# Patient Record
Sex: Male | Born: 1955
Health system: Southern US, Community
[De-identification: ages and names within clinical notes are randomized; demographics above are authoritative.]

## PROBLEM LIST (undated history)

## (undated) DIAGNOSIS — I10 Essential (primary) hypertension: Secondary | ICD-10-CM

## (undated) DIAGNOSIS — E119 Type 2 diabetes mellitus without complications: Secondary | ICD-10-CM

## (undated) DIAGNOSIS — I456 Pre-excitation syndrome: Secondary | ICD-10-CM

## (undated) DIAGNOSIS — G40909 Epilepsy, unspecified, not intractable, without status epilepticus: Secondary | ICD-10-CM

## (undated) DIAGNOSIS — Z9889 Other specified postprocedural states: Secondary | ICD-10-CM

## (undated) DIAGNOSIS — R112 Nausea with vomiting, unspecified: Secondary | ICD-10-CM

## (undated) HISTORY — PX: ROTATOR CUFF REPAIR: SHX139

---

## 1998-03-10 ENCOUNTER — Emergency Department (HOSPITAL_COMMUNITY): Admission: EM | Admit: 1998-03-10 | Discharge: 1998-03-10 | Payer: Self-pay | Admitting: Emergency Medicine

## 1998-10-14 ENCOUNTER — Emergency Department (HOSPITAL_COMMUNITY): Admission: EM | Admit: 1998-10-14 | Discharge: 1998-10-14 | Payer: Self-pay | Admitting: Emergency Medicine

## 1998-10-15 ENCOUNTER — Emergency Department (HOSPITAL_COMMUNITY): Admission: RE | Admit: 1998-10-15 | Discharge: 1998-10-16 | Payer: Self-pay | Admitting: Family Medicine

## 1998-10-15 ENCOUNTER — Encounter: Payer: Self-pay | Admitting: Family Medicine

## 2001-10-21 ENCOUNTER — Encounter: Payer: Self-pay | Admitting: Internal Medicine

## 2001-10-21 ENCOUNTER — Encounter: Admission: RE | Admit: 2001-10-21 | Discharge: 2001-10-21 | Payer: Self-pay | Admitting: Internal Medicine

## 2001-11-03 DIAGNOSIS — I456 Pre-excitation syndrome: Secondary | ICD-10-CM

## 2001-11-03 HISTORY — DX: Pre-excitation syndrome: I45.6

## 2002-01-21 ENCOUNTER — Encounter: Payer: Self-pay | Admitting: *Deleted

## 2002-01-21 ENCOUNTER — Inpatient Hospital Stay (HOSPITAL_COMMUNITY): Admission: EM | Admit: 2002-01-21 | Discharge: 2002-01-24 | Payer: Self-pay | Admitting: Emergency Medicine

## 2002-03-04 ENCOUNTER — Ambulatory Visit (HOSPITAL_COMMUNITY): Admission: RE | Admit: 2002-03-04 | Discharge: 2002-03-05 | Payer: Self-pay | Admitting: Internal Medicine

## 2003-01-28 ENCOUNTER — Inpatient Hospital Stay (HOSPITAL_COMMUNITY): Admission: EM | Admit: 2003-01-28 | Discharge: 2003-01-30 | Payer: Self-pay | Admitting: Emergency Medicine

## 2003-01-28 ENCOUNTER — Encounter: Payer: Self-pay | Admitting: Emergency Medicine

## 2003-01-30 ENCOUNTER — Encounter: Payer: Self-pay | Admitting: Cardiology

## 2003-03-20 ENCOUNTER — Encounter: Admission: RE | Admit: 2003-03-20 | Discharge: 2003-03-20 | Payer: Self-pay | Admitting: Family Medicine

## 2003-03-20 ENCOUNTER — Encounter: Payer: Self-pay | Admitting: Family Medicine

## 2003-06-23 ENCOUNTER — Observation Stay (HOSPITAL_COMMUNITY): Admission: EM | Admit: 2003-06-23 | Discharge: 2003-06-24 | Payer: Self-pay

## 2003-06-23 ENCOUNTER — Encounter: Payer: Self-pay | Admitting: Emergency Medicine

## 2003-07-10 ENCOUNTER — Encounter: Payer: Self-pay | Admitting: Emergency Medicine

## 2003-07-10 ENCOUNTER — Emergency Department (HOSPITAL_COMMUNITY): Admission: EM | Admit: 2003-07-10 | Discharge: 2003-07-11 | Payer: Self-pay | Admitting: Emergency Medicine

## 2003-07-20 ENCOUNTER — Ambulatory Visit (HOSPITAL_COMMUNITY): Admission: RE | Admit: 2003-07-20 | Discharge: 2003-07-20 | Payer: Self-pay | Admitting: Cardiology

## 2004-01-23 ENCOUNTER — Encounter: Admission: RE | Admit: 2004-01-23 | Discharge: 2004-01-23 | Payer: Self-pay | Admitting: Family Medicine

## 2004-06-16 ENCOUNTER — Emergency Department (HOSPITAL_COMMUNITY): Admission: EM | Admit: 2004-06-16 | Discharge: 2004-06-16 | Payer: Self-pay | Admitting: Family Medicine

## 2004-07-22 ENCOUNTER — Encounter: Admission: RE | Admit: 2004-07-22 | Discharge: 2004-07-22 | Payer: Self-pay | Admitting: Family Medicine

## 2004-09-17 ENCOUNTER — Ambulatory Visit: Payer: Self-pay | Admitting: Internal Medicine

## 2005-07-27 ENCOUNTER — Emergency Department (HOSPITAL_COMMUNITY): Admission: EM | Admit: 2005-07-27 | Discharge: 2005-07-27 | Payer: Self-pay | Admitting: Family Medicine

## 2006-01-02 ENCOUNTER — Encounter: Admission: RE | Admit: 2006-01-02 | Discharge: 2006-01-02 | Payer: Self-pay | Admitting: Family Medicine

## 2006-03-04 ENCOUNTER — Encounter: Payer: Self-pay | Admitting: Emergency Medicine

## 2006-08-26 ENCOUNTER — Ambulatory Visit: Payer: Self-pay | Admitting: Family Medicine

## 2006-10-02 ENCOUNTER — Ambulatory Visit: Payer: Self-pay | Admitting: Family Medicine

## 2006-11-13 ENCOUNTER — Ambulatory Visit: Payer: Self-pay | Admitting: Internal Medicine

## 2006-11-26 ENCOUNTER — Encounter: Admission: RE | Admit: 2006-11-26 | Discharge: 2007-01-21 | Payer: Self-pay | Admitting: Family Medicine

## 2006-12-27 DIAGNOSIS — I10 Essential (primary) hypertension: Secondary | ICD-10-CM

## 2006-12-27 DIAGNOSIS — G43009 Migraine without aura, not intractable, without status migrainosus: Secondary | ICD-10-CM | POA: Insufficient documentation

## 2007-07-16 ENCOUNTER — Encounter: Admission: RE | Admit: 2007-07-16 | Discharge: 2007-07-16 | Payer: Self-pay | Admitting: Family Medicine

## 2008-05-29 ENCOUNTER — Emergency Department (HOSPITAL_COMMUNITY): Admission: EM | Admit: 2008-05-29 | Discharge: 2008-05-29 | Payer: Self-pay | Admitting: Emergency Medicine

## 2008-06-14 ENCOUNTER — Encounter: Admission: RE | Admit: 2008-06-14 | Discharge: 2008-06-14 | Payer: Self-pay | Admitting: Family Medicine

## 2008-08-12 ENCOUNTER — Emergency Department (HOSPITAL_COMMUNITY): Admission: EM | Admit: 2008-08-12 | Discharge: 2008-08-12 | Payer: Self-pay | Admitting: Emergency Medicine

## 2009-01-04 ENCOUNTER — Emergency Department (HOSPITAL_COMMUNITY): Admission: EM | Admit: 2009-01-04 | Discharge: 2009-01-04 | Payer: Self-pay | Admitting: Emergency Medicine

## 2009-08-09 ENCOUNTER — Emergency Department (HOSPITAL_COMMUNITY): Admission: EM | Admit: 2009-08-09 | Discharge: 2009-08-09 | Payer: Self-pay | Admitting: Family Medicine

## 2011-02-13 LAB — POCT CARDIAC MARKERS
CKMB, poc: 1.1 ng/mL (ref 1.0–8.0)
CKMB, poc: 2.2 ng/mL (ref 1.0–8.0)
Myoglobin, poc: 109 ng/mL (ref 12–200)
Troponin i, poc: 0.07 ng/mL (ref 0.00–0.09)

## 2011-02-13 LAB — DIFFERENTIAL
Basophils Relative: 1 % (ref 0–1)
Lymphocytes Relative: 26 % (ref 12–46)
Lymphs Abs: 1.7 10*3/uL (ref 0.7–4.0)
Monocytes Absolute: 0.5 10*3/uL (ref 0.1–1.0)
Monocytes Relative: 7 % (ref 3–12)
Neutro Abs: 4.2 10*3/uL (ref 1.7–7.7)
Neutrophils Relative %: 64 % (ref 43–77)

## 2011-02-13 LAB — COMPREHENSIVE METABOLIC PANEL
Albumin: 3.7 g/dL (ref 3.5–5.2)
BUN: 8 mg/dL (ref 6–23)
Calcium: 9.1 mg/dL (ref 8.4–10.5)
Creatinine, Ser: 0.93 mg/dL (ref 0.4–1.5)
Glucose, Bld: 102 mg/dL — ABNORMAL HIGH (ref 70–99)
Potassium: 3.4 mEq/L — ABNORMAL LOW (ref 3.5–5.1)
Total Protein: 6.3 g/dL (ref 6.0–8.3)

## 2011-02-13 LAB — CBC
HCT: 39.8 % (ref 39.0–52.0)
Hemoglobin: 13.6 g/dL (ref 13.0–17.0)
MCHC: 34.3 g/dL (ref 30.0–36.0)
Platelets: 245 10*3/uL (ref 150–400)
RDW: 14.3 % (ref 11.5–15.5)

## 2011-02-13 LAB — PROTIME-INR: INR: 1 (ref 0.00–1.49)

## 2011-02-13 LAB — APTT: aPTT: 32 seconds (ref 24–37)

## 2011-03-21 NOTE — Procedures (Signed)
[place]. [facility]  Patient:    Daniel Yoder, Daniel Yoder Visit Number: [id number] MRN: [id number]          Service Type: CAT Location: 2000 2003 01 Attending Physician:  Lewayne Bunting Dictated by:   Doylene Canning. Ladona Ridgel, M.D. [facility] Proc. Date: 03/04/02 Admit Date:  03/04/2002 Discharge Date: 03/05/2002   CC:         Armanda Magic, M.D.  Meade Maw, M.D.  Kathrine Cords   Procedure Report  PROCEDURE:  Electrophysiologic study and radiofrequency catheter ablation of a far left lateral accessory pathway with manifest Wolff-Parkinson-White syndrome and inducible atrioventricular reentrant tachycardia.  INTRODUCTION:  The patient is a very pleasant 55 year old man with a long history of tachypalpitations who was recently hospitalized with documented SVT and WPW syndrome.  He continues to have symptoms despite medical therapy and is now referred for electrophysiologic study and catheter ablation.  DESCRIPTION OF PROCEDURE:  After informed consent was obtained, the patient was taken to the diagnostic EP lab in a fasted state.  After the usual preparation and draping, intravenous fentanyl and midazolam were given for sedation.  The 6 French hexapolar catheter was inserted percutaneously in the right jugular vein and advanced into the coronary sinus.  Of note, for unclear reasons the catheter could not be maneuvered all the way out into the coronary sinus but could not traverse the area past approximately 5 oclock on the mitral annulus.  Next the right femoral vein was punctured and a 5 French quadripolar catheter inserted into the right ventricle.  Next a 5 French quadripolar catheter was inserted by way of the right femoral vein into the His bundle region.  After measurement of the basic intervals, rapid ventricular pacing was carried out from the RV apex demonstrating eccentric atrial activation with earliest atrial activation in the distal coronary sinus catheter.   Additional rapid ventricular pacing down to 330 msec resulted in the retrograde accessory pathway Wenckebach cycle length.  Next programmed ventricular stimulation was carried out from the RV apex at a basic drive cycle length of 259 msec.  The S1-S2 interval was stepwise decreased from 440 msec down to 250 msec, where the retrograde accessory pathway ERP was observed.  Again during programmed ventricular stimulation the atrial activation sequence was eccentric and nondecremental.  During programmed ventricular stimulation the patient had inducible SVT at cycle lengths of 500/380.  Next rapid atrial pacing was carried out from the coronary sinus at a basic drive cycle length of 563 msec and stepwise decreased down to 370 msec.  During rapid atrial pacing from the coronary sinus there was maximal pre-excitation.  The antegrade accessory pathway Wenckebach cycle length was 370 msec.  Next programmed atrial stimulation was carried out from the coronary sinus at a basic drive cycle length of 875 msec.  The S1-S2 interval was stepwise decreased from 440 msec down to 350 msec, where the antegrade pathway ERP was observed.  Additional programmed ventricular stimulation was then carried out, resulting in the initiation of SVT.  The earliest activation during SVT was in the left lateral portion of the mitral valve annulus.  PVCs placed at the time of His bundle refractoriness resulted in clear-cut atrial pre-excitation.  A diagnosis of AV reentrant tachycardia clearly in hand utilizing the left lateral accessory pathway for V to A conduction in the circuit, the ablation catheter was subsequently maneuvered into the right femoral vein and the atrial septum was probed.  The atrial septum was found to be probe-patent for  a patent foramen ovale and the ablation catheter advanced into the left atrium, and mapping was carried out in the left atrium.  The ablation catheter was subsequently maneuvered onto  the atrial insertion of the pathway, and two RF energy applications were delivered.  During ablation there was continued accessory pathway conduction.  At this point the ablation catheter was removed and advanced into the right femoral artery and placed retrograde across the aortic valve into the left ventricle.  Additional mapping was then carried out along the left ventricular insertion of the accessory pathway on the mitral annulus.  Earlier and earlier activations were seen during ventricular pacing with the catheter being advanced more and more lateral on the mitral valve annulus.  RF energy applications were subsequently carried out between 3 oclock and 4 oclock on the ventricular insertion of the mitral annulus.  During multiple RF energy applications there was accessory pathway block but conduction recurred.  As the catheter was maneuvered up to approximately 3 oclock on the mitral valve annulus, the ninth RF energy application resulted in termination of accessory pathway conduction four seconds into RF and finally VA block.  A single bonus RF energy application was then delivered, and the patient was observed for approximately 45 minutes with no residual accessory pathway conduction and no SVT noted.  At this point the catheters were removed, hemostasis was assured, and the patient returned to his room in satisfactory condition.  COMPLICATIONS:  There were no immediate procedural complications.  RESULTS:  A. BASELINE ELECTROCARDIOGRAM:  The baseline ECG demonstrates normal sinus    rhythm with a short PR interval and manifest WPW syndrome.  The baseline    ECG suggested a left lateral accessory pathway with a large R-wave in V1.  B. BASELINE INTERVALS:  The HV interval was 28 msec and QRS duration 127 msec    prior to catheter ablation.  After catheter ablation the HV interval was    50 msec and the QRS duration was 105 msec.   C. RAPID VENTRICULAR PACING:  Rapid ventricular  pacing from the RV apex    demonstrated the earliest atrial activation in the lateral portion of the    mitral valve annulus.  The retrograde accessory pathway Wenckebach cycle    length was 330 msec.  D. PROGRAMMED VENTRICULAR STIMULATION:  Programmed ventricular stimulation    was carried out from the RV apex at a basic drive cycle length of 606 msec.    The S1-S2 interval was stepwise decreased from 440 msec down to 250 msec,    where the retrograde accessory pathway ERP was observed.  Once again during    programmed ventricular stimulation, the earliest atrial activation was    eccentric and nondecremental.  E. RAPID ATRIAL PACING:  Rapid atrial pacing was carried out from the coronary    sinus at a pacing cycle length of 490 msec and was stepwise decreased down    to 370 msec, where the antegrade pathway Wenckebach cycle length was    observed.  During rapid atrial pacing there was maximal ventricular pre-    excitation.  F. PROGRAMMED ATRIAL STIMULATION:  Programmed atrial stimulation was carried    out from the coronary sinus at a basic drive cycle length of 301 msec.  The    S1-S2 interval was stepwise decreased from 440 msec down to 350 msec, where    the antegrade pathway ERP was observed.  Again during programmed atrial    stimulation, there was maximal ventricular  pre-excitation.  G. ARRHYTHMIAS OBSERVED:  AV reentrant tachycardia.  Initiation:  Programmed    ventricular stimulation.  Duration:  Sustained.  Termination:  Spontaneous.    Cycle length:  450 msec.  During isoproterenol infusion the cycle length    was 420 msec.  H. MAPPING:  Mapping of the patients accessory pathway demonstrated a far    left lateral accessory pathway.  Mapping was made more difficult by the    coronary sinus catheter not being able to be advanced past 5 oclock on the    mitral annulus in the LAO projection.  I. RADIOFREQUENCY ENERGY APPLICATION:  A total of 10 RF energy applications     were delivered.  The first two were delivered by way of a patent foramen    ovale onto the atrial insertion of the mitral annulus, but accessory    pathway conduction persisted.  Because of the far left lateral nature of    the pathway, the ablation catheter was maneuvered into the left ventricle    retrograde across the aortic valve by way of the right femoral artery and    eight additional RF energy applications were delivered, resulting in    discontinuation of accessory pathway conduction and rendering the pathway    noninducible.  The patient was observed for 45 minutes and had no residual    accessory pathway conduction.  CONCLUSION:  This study demonstrates successful electrophysiologic study and RF catheter ablation of inducible AV reentrant tachycardia with manifest by WPW syndrome.  There were no immediate procedural complications.Dictated by: Doylene Canning. Ladona Ridgel, M.D. [facility] Attending Physician:  Lewayne Bunting DD:  03/04/02 TD:  03/07/02 Job: [id number]
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
## 2011-03-21 NOTE — H&P (Signed)
[place]. [facility]  Patient:    Daniel Yoder, Daniel Yoder Visit Number: [id number] MRN: [id number]          Service Type: MED Location: [phone] Attending Physician:  Mora Appl Dictated by:   Meade Maw, M.D. Admit Date:  01/21/2002 Discharge Date: 01/24/2002   CC:         Redmond Baseman, M.D.   History and Physical  REFERRING PHYSICIAN:  Redmond Baseman, M.D.  REASON FOR ADMISSION:  Palpitations, nonsustained ventricular tachycardia, and evidence of Wolff-Parkinson-White syndrome on 12-lead.  HISTORY OF PRESENT ILLNESS:  Eulogio is a very pleasant, 55 year old, African-American male who noticed palpitations on Wednesday morning which were intermittent and persisting for approximately 10-15 minutes.  He presented to the [facility] on Wednesday and was advised to have a cardiology work-up as an outpatient.  There was no associated chest pain, dyspnea, presyncope, or syncope.  The nurse called the patient today.  The patient reported palpitations every 5-10 minutes and he was advised to come to his family doctors office.  He was evaluated by Redmond Baseman, M.D., who subsequently faxed an EKG to Armanda Magic, M.D.  WPW was noted and the patient was recommended to the come to the ER for further evaluation and admission.  PAST MEDICAL HISTORY:  Significant for hypertension.  CURRENT MEDICATIONS:  Atenolol 50 mg daily.  ALLERGIES:  He has no known drug allergies.  PAST SURGICAL HISTORY:  Significant for arthroscopic surgery on his right hip.  REVIEW OF SYSTEMS:  He notes frequent headaches.  He has no change in bowel or bladder habits.  No tobacco use.  No alcohol use.  No illicit drug use. Occasional pedal edema.  He has two cups of coffee daily, but has not noted any association in the palpitations with caffeine intake.  SOCIAL HISTORY:  He is married and lives with his wife and 60-year-old daughter.  He works as an  Hydrographic surveyor.  He is not involved in any regular exercise program.  PHYSICAL EXAMINATION:  A middle-aged male in no acute distress.  He is anxious.  He is hesitant to go home, concerned that he may have a heart attack.  His telemetry reveals a sinus rhythm.  There is no ectopy noted.  VITAL SIGNS:  His blood pressure is 155/96, heart rate 70, he is afebrile, and respiratory rate 16.  PULMONARY:  Breath sounds which are equal and clear to auscultation.  No use of accessory muscles.  CARDIOVASCULAR:  Normal S1.  Normal S2.  Regular rate and rhythm.  No murmurs, rubs, or gallops noted.  ABDOMEN:  Soft, benign, and nontender.  No unusual bruits or pulsations noted.  EXTREMITIES:  Distal pulses which are equal and palpable.  SKIN:  Warm and dry.  NEUROLOGIC:  Nonfocal.  Motor is 5/5 throughout.  LABORATORY DATA:  The ECG reveals a sinus rhythm, intraventricular conduction delay consistent with WPW, and a PR interval of 12.  Other laboratory data is pending at this time.  IMPRESSION: 1. Ongoing palpitations and nonsustained ventricular tachycardia in a    middle-aged male who is somewhat anxious.  He will be admitted for    observation.  EPS will be consulted as the patient is rather anxious over    these episodes.  We will obtain a TSH and serial cardiac enzymes. 2. Hypertension.  The blood pressure is adequately controlled on atenolol.    The diastolic pressure is slightly elevated at this time,  most likely    secondary to anxiety. 3. Health maintenance.  Will obtain a fasting lipid profile.  Telemetry    monitoring will be continued.  The patient has been instructed to notify    the nurse when he has the symptoms so that we can verify his symptoms with    underlying arrhythmia. Dictated by:   Meade Maw, M.D. Attending Physician:  Meade Maw A DD:  01/21/02 TD:  01/22/02 Job: [id number]

## 2011-03-21 NOTE — Cardiovascular Report (Signed)
NAME:  Daniel Yoder, Daniel Yoder NO.:  [id number]   MEDICAL RECORD NO.:  [id number]                   PATIENT TYPE:  OIB   LOCATION:  2899                                 FACILITY:  [facility]   PHYSICIAN:  Salvadore Farber, M.D.             DATE OF BIRTH:  1956/08/24   DATE OF PROCEDURE:  DATE OF DISCHARGE:  07/20/2003                              CARDIAC CATHETERIZATION   PROCEDURES:  1. Left heart catheterization.  2. Left ventriculography.  3. Coronary angiography.   INDICATIONS FOR PROCEDURE:  The patient is a 55 year old gentleman with a  history of WPW treated by radiofrequency ablation by Dr. Ladona Ridgel in May 2003.  Since then he has had only rare palpitations.  Cardiac risk factors are  hypertension, hypertriglyceridemia and rare tobacco use.  He has been seen  in the emergency room three times over the past six months with chest  discomfort.  He has ruled out for a myocardial infarction each time.  Despite a negative ETT Cardiolite in March of this year he is referred for  diagnostic angiography to clear the air.   PROCEDURAL TECHNIQUE:  Informed consent was obtained.  Under 1% lidocaine  local anesthesia a 6 French sheath was placed in the right femoral artery  using the modified Seldinger technique.  Diagnostic angiography and  ventriculography were performed using JL4, JR4 and pigtail catheters.  During the procedure the J wire inadvertently entered what appeared to be  the celiac axis and extending into the hepatic artery.  Therefore, abdominal  aortography was performed to exclude perforation.  This demonstrated no  evidence of perforation.  The patient tolerated the procedure well and was  transferred to the holding room in stable condition.  The sheaths will be  removed there.   COMPLICATIONS:  None.   FINDINGS:  1. Left ventricle:  118/11/18.  Ejection fraction 65% without regional wall     motion abnormality.  2. No aortic stenosis or  mitral regurgitation.  3. Left main:  Angiographically normal.  4. Left anterior descending:  Large vessel which wraps around the apex of     the heart to supply a large portion of the inferior wall.  It gives rise     to a single diagonal branch.  The vessel is angiographically normal.  5. Ramus intermedius:  Small vessel which is angiographically normal.  6. Circumflex:  Moderate size vessel giving rise to two obtuse marginal's.     It is angiographically normal.  7. Right coronary artery:  Small but technically dominant vessel which is     angiographically normal.    IMPRESSION AND RECOMMENDATIONS:  Normal coronary arteries as well as normal  left ventricular size and systolic function.  Suspect a non-cardiac etiology  to his chest discomfort.  He will follow-up with Dr. Modesto Charon and Dr. Ladona Ridgel.  Salvadore Farber, M.D.    WED/MEDQ  D:  07/20/2003  T:  07/21/2003  Job:  [id number]   cc:   Thelma Barge P. Modesto Charon, M.D.  [address]  [place]  [place] [address]  Fax: [phone]   Doylene Canning. Ladona Ridgel, M.D.

## 2011-03-21 NOTE — Discharge Summary (Signed)
[place]. [facility]  Patient:    Daniel Yoder, Daniel Yoder Visit Number: [id number] MRN: [id number]          Service Type: CAT Location: 2000 2003 01 Attending Physician:  Lewayne Bunting Dictated by:   Joellyn Rued, P.A.-C. Admit Date:  03/04/2002 Disc. Date: 03/05/02   CC:         Redmond Baseman, M.D.  Doylene Canning. Ladona Ridgel, M.D. [facility]   Referring Physician Discharge [place]  DATE OF BIRTH:  [birth year]  ADMITTING PHYSICIAN:  Dr. Ladona Ridgel.  DISCHARGING PHYSICIAN:  Dr. Graciela Husbands.  SUMMARY OF HISTORY:  Mr. Dubray is a 55 year old black male, who was referred to Dr. Ladona Ridgel by Dr. Modesto Charon in regard to evaluation of WPW.  He was seen in the office on January 31, 2002.  He describes a long history of tachy-palpitations which have increased in frequency and severity.  On cardiac monitoring, he was noted to have SVT.  He denies any associated syncope, near-syncope, shortness of breath, or chest discomfort.  His history is notable for hypertension and is on atenolol for that.  He also has a history of hip surgery in 1996 and remote tobacco use.  LABORATORY DATA PREADMISSION:  H&H was 13.3, and 41.8.  MCH was slightly low at 24.6, and MCHC at 31.8, MCV at 77.4.  Platelets were 249, WBC 5.3, BUN 12, creatinine 1.2, glucose 151.  Potassium 4.0, sodium 135.  PT is 10.9, PTT is 29.4.  EKG prior to admission showed sinus bradycardia, evidence of WPW, with probable left lateral accessory pathway.  HOSPITAL COURSE:  Dr. Ladona Ridgel performed EP study and radiofrequency ablation on the left lateral pathway without complication.  Please refer to dictated note. Post sheath removal and bed rest, he was ambulating without difficulty, and on Mar 05, 2002, after reviewing, Dr. Graciela Husbands felt that he could be discharged home.  DISCHARGE DIAGNOSES: 1. Supraventricular tachycardia / Wolff-Parkinson-White, status post    radiofrequency ablation of left lateral pathway. 2. History of  hypertension.  DISCHARGE MEDICATIONS: 1. He is asked to continue his atenolol 50 mg q.d. for hypertension. 2. Take a coated aspirin 325 mg q.d. x 6 weeks.  DISCHARGE INSTRUCTIONS: 1. He was advised no lifting, driving, sexual activity, or heavy exertion for    two days. 2. Maintain a low salt, low fat, low cholesterol diet. 3. If he had any problems with his catheterization site, he was asked to call    our office immediately. 4. He will call on Monday to arrange a 3-4 week appointment with Dr. Ladona Ridgel. Dictated by:   Joellyn Rued, P.A.-C. Attending Physician:  Lewayne Bunting DD:  03/05/02 TD:  03/05/02 Job: [id number]

## 2011-03-21 NOTE — Discharge Summary (Signed)
[place]  Patient:    Daniel Yoder, Daniel Yoder Visit Number: [id number] MRN: [id number]          Service Type: MED Location: [phone] Attending Physician:  Meade Maw A Dictated by:   Anselm Lis, N.P. Admit Date:  01/21/2002 Discharge Date: 01/24/2002   CC:         Redmond Baseman, M.D.   Discharge Summary  DATE OF BIRTH:  1956/03/04  DISCHARGE DIAGNOSES: 1. Frequent sensations of "heart beating harder" without associated    dysrhythmia on telemetry monitor and without associated symptoms.    Electrocardiogram  reveals preexcitation ( Wolfe-Parkinson-White).    Elevated total CK of 480 but without significant MB fraction and negative    troponin I.  (CKs trended downward during the course of admission.)  A    2-D echocardiogram was done, but result is pending at the time of this    dictation. 2. History of borderline hypertension on atenolol. 3. History of early chronic obstructive pulmonary disease, quite tobacco use    September 1999. 4. History of acute polyserositis of unknown etiology involving mostly the    pleura, seen in past by Dr. Sandrea Hughs, responding well to nonsteroidals    (1999).  PLAN:  Dr. Mayford Knife spoke with Dr. Sharrell Ku who will see patient within a week after discharge for further electrophysiology evaluation and possible ablation.  HISTORY OF PRESENT ILLNESS:  Daniel Yoder is a very pleasant 55 year old gentleman who noticed sensation of his heart beating harder approximately two days prior to admission, and this sensation seemed to occur with increased frequency.  He presented to primary care Evon Lopezperez at the office where EKG was consistent with Wolfe-Parkinson-White.  The patient was referred to [facility] ER for further evaluation and admission.  HOSPITAL COURSE:  For details of hospital course as above, essentially uneventful admission.  There was no ectopy on telemetry throughout the course of his  hospital stay.  Blood pressure adequately controlled.  TSH within normal range.  DIAGNOSTIC DATA:  WBC 5.3, hemoglobin 13.4, hematocrit 38.8, platelets 246. Coags within normal range.  Sodium 140, potassium 4.1, chloride 106, CO2 29, glucose 114, BUN 12, creatinine 1.1.  LFTs all within normal range.  First CK 480, MB fraction 4.9, troponin I 0.01.  Second CK 414, MB fraction 3.3, troponin I 0.03.  Third CK 404, MB fraction 3.5.  Cholesterol 193, TSH within normal range at 1.448.  EKG revealed normal sinus rhythm with intraventricular conduction delay consistent with Wolfe-Parkinson-White with PR interval of 12.  Total time preparing discharge approximately 30 minutes including dictating Discharge Summary, filling out and reviewing discharge instructions for patient, and arranging followup office visit with Dr. Bruna Potter office. Dictated by:   Anselm Lis, N.P. Attending Physician:  Mora Appl DD:  01/31/02 TD:  01/31/02 Job: [phone] [id number]
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
## 2011-03-21 NOTE — Assessment & Plan Note (Signed)
[name]                          [name]   NAME:Daniel Yoder, Daniel Yoder                      MRN:          [id number]  DATE:08/26/2006                            DOB:          1956/05/09    REASON FOR VISIT:  Migraines.   Mr. Husted is a 55 year old male patient of Dr. Modesto Charon, here to establish.  He reports that about 3 months ago, he was having visual disturbances.  He  was referred by Dr. Marny Lowenstein to see an ophthalmologist, who diagnosed him with  migraines.  He reports that he has had 2 to 3 migraines per month.  He  describes it as a visual aura that starts in the left eye and progresses  to a left temporal headache associated with photophobia and noise  sensitivity.  He does have some mild nausea associated with these symptoms.  Usually he is able to treat them with over-the-counter migraine medicines.  Of note, Mr. Buckwalter does have a neurologist, i.e., Dr. Alton Revere, who treats  him for epilepsy.  According to Mr. Grewe, he has told Dr. Alton Revere of his  migraines.  He was put on Topamax to treat both his epilepsy and migraines,  but, unfortunately, was not able to tolerate it.  He is currently on  Lamictal for his epilepsy.  Mr. Cimo states that there has been no  discussion regarding acute treatment of his migraine, but he does report  that the over-the-counter have been helpful in decreasing the intensity of  the pain.  He has noticed some precipitating cause, i.e. perfume.  Mr.  Feinstein had 1 migraine this morning, which has decreased in intensity as of  now.   PAST MEDICAL HISTORY:  1. Hypertension.  2. WPW status post cardiac ablation.  3. Migraine.  4. Epilepsy.   MEDICATIONS:  1. Norvasc 10 mg daily.  2. Atenolol 100 mg daily.  3. Lamictal 175 mg.   ALLERGIES:  No known drug allergies.   FAMILY HISTORY:  Noncontributory.   The patient is married with no children.  Denies any tobacco use.   REVIEW OF  SYSTEMS:  As per HPI, otherwise unremarkable.   OBJECTIVE:  Weight 202, temperature 98.3, blood pressure 146/98.  GENERAL:  We have a pleasant male, in no acute distress.  Answers questions  appropriately.  Alert and oriented x3.  HEENT:  Normocephalic, atraumatic.  Pupils are equal and reactive to light.  Extraocular muscles were intact.  No nystagmus.  NECK:  Supple.  No lymphadenopathy, carotid bruits, or JVD.  No thyromegaly.  LUNGS:  Clear.  HEART:  Regular rate and rhythm.  Normal S1, S2.  No murmurs, gallops, or  rubs heard on my examination.  EXTREMITIES:  No cyanosis, clubbing, or edema.  NEUROLOGIC:  Cranial nerves 2-12 grossly intact.  No focal sensory or motor  deficits were noted.  Cerebellar function was within normal limits.  No  pronator drift was appreciated.  Gait was uninhibited.  Mood was within  normal limits.   IMPRESSION:  1. A 55 year old male with a history of migraines.  The patient does have  a neurologist, but has not discussed with her the acute treatment of      migraine, but previously was put on Topamax, which he was not able to      tolerate.  2. Hypertension.  3. History of Wolff-Parkinson-White, status post ablation.  4. Epilepsy.   PLAN:  1. A long discussion regarding migraines and treatment was reviewed with      the patient.  Given that he has seen a neurologist, I felt that it may      be prudent that he continue care with her and discuss treatment for his      migraines since there is some cross-therapy in regards to prophylaxis      to migraines and epilepsy.  2. I did discuss the possibility of using triptans, but given his history      of hypertension as well as WPW status post ablation, I felt a little      hesitant starting these types of medication, i.e., since he has already      seen a neurologist and a previous physician who did not mention the use      of this therapy.  3. Advised the patient that we should get a copy of his  medical records to      review his history a little more thoroughly over the last year when the      diagnoses of epilepsy and migraine were made.  The patient was also      advised to keep a headache diary to see if he sees any other associated      precipitants.  4. Will try to contact Dr. Alton Revere to discuss what is the best approach in      treating Mr. [place] migraine, and I will most likely defer treatment      to her.   Again, the patient will follow up in 1 month to reassess his blood pressure  given the mild elevation today.            ______________________________  Leanne Chang, M.D.      LA/MedQ  DD:  08/26/2006  DT:  08/27/2006  Job #:  [id number]
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
## 2011-08-01 LAB — POCT I-STAT, CHEM 8
Calcium, Ion: 1.13
Chloride: 106
HCT: 42
Sodium: 141

## 2011-08-01 LAB — POCT CARDIAC MARKERS
CKMB, poc: 2.4
Troponin i, poc: <0.05

## 2014-01-16 ENCOUNTER — Emergency Department (INDEPENDENT_AMBULATORY_CARE_PROVIDER_SITE_OTHER)
Admission: EM | Admit: 2014-01-16 | Discharge: 2014-01-16 | Disposition: A | Discharge status: Home / Self Care | Payer: 59 | Source: Home / Self Care | Attending: Emergency Medicine | Admitting: Emergency Medicine

## 2014-01-16 ENCOUNTER — Encounter (HOSPITAL_COMMUNITY): Payer: Self-pay | Admitting: Emergency Medicine

## 2014-01-16 DIAGNOSIS — S80819A Abrasion, unspecified lower leg, initial encounter: Secondary | ICD-10-CM

## 2014-01-16 DIAGNOSIS — IMO0002 Reserved for concepts with insufficient information to code with codable children: Secondary | ICD-10-CM

## 2014-01-16 HISTORY — DX: Essential (primary) hypertension: I10

## 2014-01-16 NOTE — Discharge Instructions (Signed)
Abrasion °An abrasion is a cut or scrape of the skin. Abrasions do not extend through all layers of the skin and most heal within 10 days. It is important to care for your abrasion properly to prevent infection. °CAUSES  °Most abrasions are caused by falling on, or gliding across, the ground or other surface. When your skin rubs on something, the outer and inner layer of skin rubs off, causing an abrasion. °DIAGNOSIS  °Your caregiver will be able to diagnose an abrasion during a physical exam.  °TREATMENT  °Your treatment depends on how large and deep the abrasion is. Generally, your abrasion will be cleaned with water and a mild soap to remove any dirt or debris. An antibiotic ointment may be put over the abrasion to prevent an infection. A bandage (dressing) may be wrapped around the abrasion to keep it from getting dirty.  °You may need a tetanus shot if: °· You cannot remember when you had your last tetanus shot. °· You have never had a tetanus shot. °· The injury broke your skin. °If you get a tetanus shot, your arm may swell, get red, and feel warm to the touch. This is common and not a problem. If you need a tetanus shot and you choose not to have one, there is a rare chance of getting tetanus. Sickness from tetanus can be serious.  °HOME CARE INSTRUCTIONS  °· If a dressing was applied, change it at least once a day or as directed by your caregiver. If the bandage sticks, soak it off with warm water.   °· Wash the area with water and a mild soap to remove all the ointment 2 times a day. Rinse off the soap and pat the area dry with a clean towel.   °· Reapply any ointment as directed by your caregiver. This will help prevent infection and keep the bandage from sticking. Use gauze over the wound and under the dressing to help keep the bandage from sticking.   °· Change your dressing right away if it becomes wet or dirty.   °· Only take over-the-counter or prescription medicines for pain, discomfort, or fever as  directed by your caregiver.   °· Follow up with your caregiver within 24 48 hours for a wound check, or as directed. If you were not given a wound-check appointment, look closely at your abrasion for redness, swelling, or pus. These are signs of infection. °SEEK IMMEDIATE MEDICAL CARE IF:  °· You have increasing pain in the wound.   °· You have redness, swelling, or tenderness around the wound.   °· You have pus coming from the wound.   °· You have a fever or persistent symptoms for more than 2 3 days. °· You have a fever and your symptoms suddenly get worse. °· You have a bad smell coming from the wound or dressing.   °MAKE SURE YOU:  °· Understand these instructions. °· Will watch your condition. °· Will get help right away if you are not doing well or get worse. °Document Released: 07/30/2005 Document Revised: 10/06/2012 Document Reviewed: 09/23/2011 °Exit[employer]® Patient Information ©2014 [employer]. ° °

## 2014-01-16 NOTE — ED Notes (Signed)
C/o rash on left lower leg.  Painful to touch.  Stinging sensation.  Noticed about a week ago.  No otc meds tried.   Denies any changes in soaps/detergents. Or meds.

## 2014-01-16 NOTE — ED Provider Notes (Signed)
Medical screening examination/treatment/procedure(s) were performed by non-physician practitioner and as supervising physician I was immediately available for consultation/collaboration.  Philipp Deputy, M.D.  Harden Mo, MD 01/16/14 [phone]

## 2014-01-16 NOTE — ED Provider Notes (Signed)
CSN: [id number]     Arrival date & time 01/16/14  1933 History   First MD Initiated Contact with Patient 01/16/14 2050     Chief Complaint  Patient presents with  . Rash   (Consider location/radiation/quality/duration/timing/severity/associated sxs/prior Treatment) HPI Comments: 67m presents for eval of rash to the lateral left lower leg.  This has been presents for 5 days.  Was initially swollen and red.  Has had a burning and stinging sensation.  They are worried it might be shingles.  No fever, chills, NVD, SOB.  No Hx of similar rash or singles.  No known injury.     Past Medical History  Diagnosis Date  . Hypertension    History reviewed. No pertinent past surgical history. History reviewed. No pertinent family history. History  Substance Use Topics  . Smoking status: Current Every Day Smoker -- 0.50 packs/day    Types: Cigarettes  . Smokeless tobacco: Not on file  . Alcohol Use: No    Review of Systems  Constitutional: Negative for fever, chills and fatigue.  HENT: Negative for sore throat.   Eyes: Negative for visual disturbance.  Respiratory: Negative for cough and shortness of breath.   Cardiovascular: Negative for chest pain, palpitations and leg swelling.  Gastrointestinal: Negative for nausea, vomiting, abdominal pain, diarrhea and constipation.  Genitourinary: Negative for dysuria, urgency, frequency and hematuria.  Musculoskeletal: Negative for arthralgias, myalgias, neck pain and neck stiffness.  Skin: Positive for rash (see HPI).  Neurological: Negative for dizziness, weakness and light-headedness.    Allergies  Review of patient's allergies indicates no known allergies.  Home Medications   Current Outpatient Rx  Name  Route  Sig  Dispense  Refill  . amLODipine (NORVASC) 10 MG tablet   Oral   Take 10 mg by mouth daily.         Marland Kitchen atenolol (TENORMIN) 100 MG tablet   Oral   Take 100 mg by mouth daily.          BP 166/108  Pulse 101  Temp(Src)  98.3 F (36.8 C) (Oral)  Resp 20  SpO2 97% Physical Exam  Nursing note and vitals reviewed. Constitutional: He is oriented to person, place, and time. He appears well-developed and well-nourished. No distress.  HENT:  Head: Normocephalic.  Pulmonary/Chest: Effort normal. No respiratory distress.  Musculoskeletal:       Legs: Neurological: He is alert and oriented to person, place, and time. Coordination normal.  Skin: Skin is warm and dry. No rash noted. He is not diaphoretic.  Psychiatric: He has a normal mood and affect. Judgment normal.    ED Course  Procedures (including critical care time) Labs Review Labs Reviewed - No data to display Imaging Review No results found.   MDM   1. Abrasion of leg    Obvious abrasion. Reassured the patient. He will wait a few days to see if this starts to get better. Followup when necessary  Liam Graham, PA-C 01/16/14 2206

## 2014-12-12 ENCOUNTER — Ambulatory Visit
Admission: RE | Admit: 2014-12-12 | Discharge: 2014-12-12 | Disposition: A | Discharge status: Home / Self Care | Payer: 59 | Source: Ambulatory Visit | Attending: Unknown Physician Specialty | Admitting: Unknown Physician Specialty

## 2014-12-12 ENCOUNTER — Other Ambulatory Visit: Payer: Self-pay | Admitting: Unknown Physician Specialty

## 2014-12-12 DIAGNOSIS — M79672 Pain in left foot: Secondary | ICD-10-CM

## 2015-11-16 ENCOUNTER — Emergency Department (HOSPITAL_COMMUNITY): Payer: 59

## 2015-11-16 ENCOUNTER — Observation Stay (HOSPITAL_COMMUNITY)
Admission: EM | Admit: 2015-11-16 | Discharge: 2015-11-18 | Disposition: A | Discharge status: Home / Self Care | Payer: 59 | Attending: Internal Medicine | Admitting: Internal Medicine

## 2015-11-16 ENCOUNTER — Encounter (HOSPITAL_COMMUNITY): Payer: Self-pay | Admitting: Family Medicine

## 2015-11-16 DIAGNOSIS — R0602 Shortness of breath: Secondary | ICD-10-CM | POA: Diagnosis not present

## 2015-11-16 DIAGNOSIS — R072 Precordial pain: Secondary | ICD-10-CM | POA: Diagnosis not present

## 2015-11-16 DIAGNOSIS — E119 Type 2 diabetes mellitus without complications: Secondary | ICD-10-CM | POA: Insufficient documentation

## 2015-11-16 DIAGNOSIS — Z7984 Long term (current) use of oral hypoglycemic drugs: Secondary | ICD-10-CM | POA: Diagnosis not present

## 2015-11-16 DIAGNOSIS — E785 Hyperlipidemia, unspecified: Secondary | ICD-10-CM | POA: Diagnosis not present

## 2015-11-16 DIAGNOSIS — E860 Dehydration: Secondary | ICD-10-CM | POA: Diagnosis not present

## 2015-11-16 DIAGNOSIS — Z79899 Other long term (current) drug therapy: Secondary | ICD-10-CM | POA: Insufficient documentation

## 2015-11-16 DIAGNOSIS — Z791 Long term (current) use of non-steroidal anti-inflammatories (NSAID): Secondary | ICD-10-CM | POA: Insufficient documentation

## 2015-11-16 DIAGNOSIS — R079 Chest pain, unspecified: Secondary | ICD-10-CM | POA: Diagnosis present

## 2015-11-16 DIAGNOSIS — F17210 Nicotine dependence, cigarettes, uncomplicated: Secondary | ICD-10-CM | POA: Insufficient documentation

## 2015-11-16 DIAGNOSIS — E1129 Type 2 diabetes mellitus with other diabetic kidney complication: Secondary | ICD-10-CM

## 2015-11-16 DIAGNOSIS — I10 Essential (primary) hypertension: Secondary | ICD-10-CM | POA: Diagnosis not present

## 2015-11-16 DIAGNOSIS — R0600 Dyspnea, unspecified: Secondary | ICD-10-CM | POA: Diagnosis present

## 2015-11-16 DIAGNOSIS — M1990 Unspecified osteoarthritis, unspecified site: Secondary | ICD-10-CM | POA: Diagnosis not present

## 2015-11-16 HISTORY — DX: Pre-excitation syndrome: I45.6

## 2015-11-16 LAB — CBC
HCT: 37.8 % — ABNORMAL LOW (ref 39.0–52.0)
HEMOGLOBIN: 13.2 g/dL (ref 13.0–17.0)
MCH: 26.5 pg (ref 26.0–34.0)
MCHC: 34.9 g/dL (ref 30.0–36.0)
MCV: 75.9 fL — ABNORMAL LOW (ref 78.0–100.0)
Platelets: 262 10*3/uL (ref 150–400)
RBC: 4.98 MIL/uL (ref 4.22–5.81)
RDW: 15 % (ref 11.5–15.5)
WBC: 7.3 10*3/uL (ref 4.0–10.5)

## 2015-11-16 LAB — BASIC METABOLIC PANEL
Anion gap: 13 (ref 5–15)
BUN: 24 mg/dL — AB (ref 6–20)
CALCIUM: 10 mg/dL (ref 8.9–10.3)
CO2: 24 mmol/L (ref 22–32)
CREATININE: 1.64 mg/dL — AB (ref 0.61–1.24)
Chloride: 103 mmol/L (ref 101–111)
GFR calc Af Amer: 52 mL/min — ABNORMAL LOW (ref >60)
GFR, EST NON AFRICAN AMERICAN: 45 mL/min — AB (ref >60)
GLUCOSE: 121 mg/dL — AB (ref 65–99)
Potassium: 4.2 mmol/L (ref 3.5–5.1)
Sodium: 140 mmol/L (ref 135–145)

## 2015-11-16 LAB — BRAIN NATRIURETIC PEPTIDE: B NATRIURETIC PEPTIDE 5: 14 pg/mL (ref 0.0–100.0)

## 2015-11-16 LAB — I-STAT TROPONIN, ED: TROPONIN I, POC: 0 ng/mL (ref 0.00–0.08)

## 2015-11-16 LAB — TROPONIN I: Troponin I: <0.03 ng/mL (ref <0.031)

## 2015-11-16 LAB — D-DIMER, QUANTITATIVE: D-Dimer, Quant: <0.27 ug/mL-FEU (ref 0.00–0.50)

## 2015-11-16 MED ORDER — SODIUM CHLORIDE 0.9 % IV BOLUS (SEPSIS)
1000.0000 mL | Freq: Once | INTRAVENOUS | Status: AC
  Administered 2015-11-16: 1000 mL via INTRAVENOUS

## 2015-11-16 NOTE — ED Notes (Signed)
Admitting MD at bedside.

## 2015-11-16 NOTE — ED Notes (Signed)
Pt here for chest pain over the past few days in the middle of his chest. sts stinging. sts that he is SOB upon exertion. sts his vision has been blurry.

## 2015-11-16 NOTE — ED Provider Notes (Signed)
CSN: [id number]     Arrival date & time 11/16/15  24 History   First MD Initiated Contact with Patient 11/16/15 1811     Chief Complaint  Patient presents with  . Chest Pain  . Shortness of Breath     (Consider location/radiation/quality/duration/timing/severity/associated sxs/prior Treatment) Patient is a 60 y.o. male presenting with chest pain.  Chest Pain Pain location:  L chest and substernal area Pain quality: sharp   Pain radiates to:  Does not radiate Pain radiates to the back: no   Pain severity:  Mild Context: movement   Context: not breathing, not eating and not lifting   Associated symptoms: shortness of breath   Associated symptoms: no cough     Past Medical History  Diagnosis Date  . Hypertension    History reviewed. No pertinent past surgical history. History reviewed. No pertinent family history. Social History  Substance Use Topics  . Smoking status: Current Every Day Smoker -- 0.00 packs/day    Types: Cigarettes  . Smokeless tobacco: None  . Alcohol Use: No    Review of Systems  Constitutional: Negative for chills.  Eyes: Negative for pain.  Respiratory: Positive for shortness of breath. Negative for cough.   Cardiovascular: Positive for chest pain.  Endocrine: Negative for polydipsia and polyuria.  Genitourinary: Negative for frequency.  All other systems reviewed and are negative.     Allergies  Review of patient's allergies indicates no known allergies.  Home Medications   Prior to Admission medications   Medication Sig Start Date End Date Taking? Authorizing Provider  amLODipine (NORVASC) 10 MG tablet Take 10 mg by mouth daily.    Historical Provider, MD  atenolol (TENORMIN) 100 MG tablet Take 100 mg by mouth daily.    Historical Provider, MD   BP 155/101 mmHg  Pulse 87  Temp(Src) 97.7 F (36.5 C) (Oral)  Resp 16  SpO2 97% Physical Exam  Constitutional: He is oriented to person, place, and time. He appears well-developed and  well-nourished.  HENT:  Head: Normocephalic and atraumatic.  Eyes: Conjunctivae are normal. Pupils are equal, round, and reactive to light.  Neck: Normal range of motion.  Cardiovascular: Normal rate.   Pulmonary/Chest: Effort normal. No respiratory distress. He has no wheezes. He has no rales.  Abdominal: Soft. He exhibits no distension. There is no tenderness. There is no rebound.  Musculoskeletal: Normal range of motion. He exhibits no edema.  Neurological: He is alert and oriented to person, place, and time.  Skin: Skin is warm and dry.  Nursing note and vitals reviewed.   ED Course  Procedures (including critical care time)    EMERGENCY DEPARTMENT [place] CARDIAC EXAM "Study: Limited Ultrasound of the heart and pericardium"  INDICATIONS:Dyspnea Multiple views of the heart and pericardium were obtained in real-time with a multi-frequency probe.  PERFORMED [id number]  IMAGES ARCHIVED?: Yes  FINDINGS: No pericardial effusion, Normal contractility and Tamponade physiology absent  LIMITATIONS:  Body habitus  VIEWS USED: Subcostal 4 chamber and Apical 4 chamber   INTERPRETATION: Cardiac activity present, Pericardial effusioin absent, Cardiac tamponade absent and Normal contractility  CPT Code: [id number] (limited transthoracic cardiac)   Labs Review Labs Reviewed  BASIC METABOLIC PANEL - Abnormal; Notable for the following:    Glucose, Bld 121 (*)    BUN 24 (*)    Creatinine, Ser 1.64 (*)    GFR calc non Af Amer 45 (*)    GFR calc Af Amer 52 (*)    All other components within  normal limits  CBC - Abnormal; Notable for the following:    HCT 37.8 (*)    MCV 75.9 (*)    All other components within normal limits  BRAIN NATRIURETIC PEPTIDE  D-DIMER, QUANTITATIVE (NOT AT [facility])  TROPONIN I  CBC  CREATININE, SERUM  TROPONIN I  APTT  PROTIME-INR  HEMOGLOBIN A1C  I-STAT TROPOININ, ED    Imaging Review Dg Chest 2 View  11/16/2015  CLINICAL DATA:  Left-sided chest pain.  Shortness of breath. Blurred vision. Hypertension. EXAM: CHEST  2 VIEW COMPARISON:  06/28/2012 FINDINGS: The heart size and mediastinal contours are within normal limits. Mild scarring again seen in the left lung base. No evidence acute pulmonary infiltrate or edema. No evidence of pleural effusion. No evidence of pneumothorax. Mild thoracic spine degenerative changes again noted. IMPRESSION: No active cardiopulmonary disease. Electronically Signed   By: Earle Gell M.D.   On: 11/16/2015 18:01   I have personally reviewed and evaluated these images and lab results as part of my medical decision-making.   EKG Interpretation   Date/Time:  Friday November 16 2015 16:37:30 EST Ventricular Rate:  89 PR Interval:  178 QRS Duration: 84 QT Interval:  372 QTC Calculation: 452 R Axis:   17 Text Interpretation:  Normal sinus rhythm Normal ECG No significant change  since last tracing Confirmed by [facility] MD, Corene Cornea [phone]) on 11/16/2015  6:15:11 PM      MDM   Final diagnoses:  Chest pain  Chest pain    59 year old male with some chest pain over last 3 or 4 days and 2 weeks of worsening dyspnea on exertion. D-dimer negative signed out PE. Initial troponin was negative and EKG with no evidence of active ischemia however does have LVH. I did do a bedside ultrasound to evaluate for pericardial effusion as he recently had a viral infection however this was negative. Did show evidence of LVH as did his EKG. I discussed case cardiology and felt he needed a stress test soon and if he can get one within a few days and his primary doctor that being admitted to get one would be appropriate. I discussed case with the hospitalist who will admit for the same.    Merrily Pew, MD 11/17/15 0030

## 2015-11-17 ENCOUNTER — Observation Stay (HOSPITAL_COMMUNITY): Payer: 59

## 2015-11-17 ENCOUNTER — Encounter (HOSPITAL_COMMUNITY): Payer: Self-pay | Admitting: Internal Medicine

## 2015-11-17 DIAGNOSIS — E860 Dehydration: Secondary | ICD-10-CM

## 2015-11-17 DIAGNOSIS — R079 Chest pain, unspecified: Secondary | ICD-10-CM | POA: Diagnosis not present

## 2015-11-17 DIAGNOSIS — I10 Essential (primary) hypertension: Secondary | ICD-10-CM | POA: Diagnosis not present

## 2015-11-17 DIAGNOSIS — R0600 Dyspnea, unspecified: Secondary | ICD-10-CM | POA: Diagnosis not present

## 2015-11-17 DIAGNOSIS — R0789 Other chest pain: Secondary | ICD-10-CM | POA: Diagnosis not present

## 2015-11-17 DIAGNOSIS — E1129 Type 2 diabetes mellitus with other diabetic kidney complication: Secondary | ICD-10-CM

## 2015-11-17 LAB — LIPID PANEL
CHOL/HDL RATIO: 6.2 ratio
CHOLESTEROL: 187 mg/dL (ref 0–200)
HDL: 30 mg/dL — ABNORMAL LOW (ref >40)
LDL CALC: 108 mg/dL — AB (ref 0–99)
TRIGLYCERIDES: 244 mg/dL — AB (ref <150)
VLDL: 49 mg/dL — AB (ref 0–40)

## 2015-11-17 LAB — CREATININE, SERUM
Creatinine, Ser: 1.4 mg/dL — ABNORMAL HIGH (ref 0.61–1.24)
GFR calc Af Amer: >60 mL/min (ref >60)
GFR calc non Af Amer: 54 mL/min — ABNORMAL LOW (ref >60)

## 2015-11-17 LAB — BASIC METABOLIC PANEL
ANION GAP: 6 (ref 5–15)
BUN: 15 mg/dL (ref 6–20)
CALCIUM: 9.2 mg/dL (ref 8.9–10.3)
CHLORIDE: 105 mmol/L (ref 101–111)
CO2: 27 mmol/L (ref 22–32)
CREATININE: 1.2 mg/dL (ref 0.61–1.24)
GFR calc non Af Amer: >60 mL/min (ref >60)
Glucose, Bld: 100 mg/dL — ABNORMAL HIGH (ref 65–99)
Potassium: 3.9 mmol/L (ref 3.5–5.1)
SODIUM: 138 mmol/L (ref 135–145)

## 2015-11-17 LAB — PROTIME-INR
INR: 1.06 (ref 0.00–1.49)
Prothrombin Time: 14 seconds (ref 11.6–15.2)

## 2015-11-17 LAB — CBC
HCT: 33.9 % — ABNORMAL LOW (ref 39.0–52.0)
Hemoglobin: 11.9 g/dL — ABNORMAL LOW (ref 13.0–17.0)
MCH: 26.9 pg (ref 26.0–34.0)
MCHC: 35.1 g/dL (ref 30.0–36.0)
MCV: 76.5 fL — AB (ref 78.0–100.0)
PLATELETS: 217 10*3/uL (ref 150–400)
RBC: 4.43 MIL/uL (ref 4.22–5.81)
RDW: 14.9 % (ref 11.5–15.5)
WBC: 5.9 10*3/uL (ref 4.0–10.5)

## 2015-11-17 LAB — APTT: aPTT: 29 seconds (ref 24–37)

## 2015-11-17 LAB — GLUCOSE, CAPILLARY
Glucose-Capillary: 137 mg/dL — ABNORMAL HIGH (ref 65–99)
Glucose-Capillary: 114 mg/dL — ABNORMAL HIGH (ref 65–99)
Glucose-Capillary: 127 mg/dL — ABNORMAL HIGH (ref 65–99)

## 2015-11-17 LAB — TROPONIN I

## 2015-11-17 LAB — MAGNESIUM: MAGNESIUM: 2.3 mg/dL (ref 1.7–2.4)

## 2015-11-17 MED ORDER — METFORMIN HCL 500 MG PO TABS: 500.0000 mg | ORAL_TABLET | Freq: Two times a day (BID) | ORAL | Status: DC

## 2015-11-17 MED ORDER — CARVEDILOL 25 MG PO TABS
25.0000 mg | ORAL_TABLET | Freq: Two times a day (BID) | ORAL | Status: DC
  Administered 2015-11-17 – 2015-11-18 (×2): 25 mg via ORAL
  Filled 2015-11-17 (×2): qty 1

## 2015-11-17 MED ORDER — AMLODIPINE BESYLATE 10 MG PO TABS
10.0000 mg | ORAL_TABLET | Freq: Every day | ORAL | Status: DC
  Administered 2015-11-17: 10 mg via ORAL
  Filled 2015-11-17: qty 1

## 2015-11-17 MED ORDER — ONDANSETRON HCL 4 MG/2ML IJ SOLN: 4.0000 mg | Freq: Four times a day (QID) | INTRAMUSCULAR | Status: DC | PRN

## 2015-11-17 MED ORDER — ASPIRIN EC 325 MG PO TBEC
325.0000 mg | DELAYED_RELEASE_TABLET | Freq: Every day | ORAL | Status: DC
  Administered 2015-11-17 – 2015-11-18 (×3): 325 mg via ORAL
  Filled 2015-11-17 (×3): qty 1

## 2015-11-17 MED ORDER — TECHNETIUM TC 99M SESTAMIBI GENERIC - CARDIOLITE
10.0000 | Freq: Once | INTRAVENOUS | Status: AC | PRN
  Administered 2015-11-17: 10 via INTRAVENOUS

## 2015-11-17 MED ORDER — TRIAMTERENE-HCTZ 37.5-25 MG PO CAPS
1.0000 | ORAL_CAPSULE | Freq: Every day | ORAL | Status: DC
  Administered 2015-11-17 (×2): 1 via ORAL
  Filled 2015-11-17 (×3): qty 1

## 2015-11-17 MED ORDER — AMLODIPINE BESYLATE 5 MG PO TABS
5.0000 mg | ORAL_TABLET | Freq: Every day | ORAL | Status: DC
  Administered 2015-11-17: 5 mg via ORAL
  Filled 2015-11-17: qty 1

## 2015-11-17 MED ORDER — TECHNETIUM TC 99M SESTAMIBI GENERIC - CARDIOLITE
30.0000 | Freq: Once | INTRAVENOUS | Status: AC | PRN
  Administered 2015-11-17: 30 via INTRAVENOUS

## 2015-11-17 MED ORDER — ALPRAZOLAM 0.5 MG PO TABS: 0.5000 mg | ORAL_TABLET | Freq: Every evening | ORAL | Status: DC | PRN

## 2015-11-17 MED ORDER — CARVEDILOL 25 MG PO TABS: 25.0000 mg | ORAL_TABLET | Freq: Two times a day (BID) | ORAL | Status: DC

## 2015-11-17 MED ORDER — ACETAMINOPHEN 325 MG PO TABS: 650.0000 mg | ORAL_TABLET | Freq: Four times a day (QID) | ORAL | Status: DC | PRN

## 2015-11-17 MED ORDER — SODIUM CHLORIDE 0.45 % IV SOLN
INTRAVENOUS | Status: DC
  Administered 2015-11-17: 01:00:00 via INTRAVENOUS
  Administered 2015-11-17: 125 mL/h via INTRAVENOUS

## 2015-11-17 MED ORDER — DICLOFENAC SODIUM 75 MG PO TBEC
75.0000 mg | DELAYED_RELEASE_TABLET | Freq: Every day | ORAL | Status: DC
  Administered 2015-11-17 – 2015-11-18 (×2): 75 mg via ORAL
  Filled 2015-11-17 (×3): qty 1

## 2015-11-17 MED ORDER — SODIUM CHLORIDE 0.9 % IJ SOLN
3.0000 mL | Freq: Two times a day (BID) | INTRAMUSCULAR | Status: DC
  Administered 2015-11-17 – 2015-11-18 (×3): 3 mL via INTRAVENOUS

## 2015-11-17 MED ORDER — REGADENOSON 0.4 MG/5ML IV SOLN
INTRAVENOUS | Status: AC
  Administered 2015-11-17: 13:00:00
  Filled 2015-11-17: qty 5

## 2015-11-17 MED ORDER — ENOXAPARIN SODIUM 40 MG/0.4ML ~~LOC~~ SOLN
40.0000 mg | SUBCUTANEOUS | Status: DC
  Administered 2015-11-17 – 2015-11-18 (×2): 40 mg via SUBCUTANEOUS
  Filled 2015-11-17 (×3): qty 0.4

## 2015-11-17 MED ORDER — ONDANSETRON HCL 4 MG PO TABS: 4.0000 mg | ORAL_TABLET | Freq: Four times a day (QID) | ORAL | Status: DC | PRN

## 2015-11-17 NOTE — Progress Notes (Signed)
Reviewed stress test results. Normal perfusion without evidence of ischemia. Awaiting echo results. Can probably go home if echo normal. Awaiting results.

## 2015-11-17 NOTE — Progress Notes (Signed)
I have seen and assessed patient and agree with Dr Olevia Bowens assessment and plan. Patient is a pleasant 60 year old gentleman history of hypertension, type 2 diabetes, hyperlipidemia, tobacco abuse presented to the ED with several week history of worsening shortness of breath on minimal activity with some associated chest pain that started the day of admission. Cardiac workup underway. Cardiac enzymes negative 2. Check a 2-D echo. Nuclear medicine stress test was ordered. Will consult with cardiology for further evaluation and management. Patient's story is worrisome for cardiac disease.?? Need for catheterization however will defer to cardiology.

## 2015-11-17 NOTE — Discharge Summary (Signed)
Physician Discharge Summary  Daniel Yoder [id number] DOB: 07/22/1956 DOA: 11/16/2015  PCP: Simona Huh, MD  Admit date: 11/16/2015 Discharge date: 11/17/2015  Time spent: 65 minutes  Recommendations for Outpatient Follow-up:  1. Follow-up with Simona Huh, MD in 1-2 weeks. Follow-up patient shortness of breath that need to be reassessed. Patient may benefit from referral for a sleep study. 2-D echo need to be followed up upon. Patient also need a basic metabolic profile done to follow-up on electrolytes and renal function. Patient blood pressure need to be reassessed as his atenolol has been discontinued and patient placed on Coreg instead.   Discharge Diagnoses:  Principal Problem:   Chest pain Active Problems:   Essential hypertension   Dyspnea   Type 2 diabetes mellitus ([place])   Dehydration   Discharge Condition: Stable and improved.  Diet recommendation: Carb modified  Filed Weights   11/16/15 1835 11/17/15 0000  Weight: 92.08 kg (203 lb) 89.041 kg (196 lb 4.8 oz)    History of present illness:  Per Dr. Otho Najjar is a 60 y.o. male with a past medical history of hypertension, type 2 diabetes, osteoarthritis, who presented to the emergency department due to precordial chest pain on and off for several days associated with dyspnea on exertion.  Per patient, over the past 2 weeks he has been feeling progressively short of breath. He stated that usual activities, like going out to his mailbox or to pick up the newspaper, are getting him fatigued quickly. He has been noticing at work that he is getting higher quicker, and today noticed that he became short of breath walking from one building to another at work. In the past few days, he has had precordial stinging chest pain, non-radiated, associated with dyspnea, but denied nausea, diaphoresis, pitting edema lower extremity, PND orthopnea. He stated that he occasionally felt dizzy, but this may not be related  to his chest pain.  Workup in the ER showed a nonischemic EKG, negative troponin, negative d-dimer. He was chest pain-free in the ED on admission. However, given all his risk factors, he will be admitted to telemetry for troponin levels cycling and evaluation by cardiology in a.m.  Hospital Course: The #1 chest pain/Dyspnea Patient was admitted with chest pain and shortness of breath and due to his multiple cardiac risk factors cardiac workup was undertaken. Cardiac enzymes were cycled which were negative 3. EKG had no ischemic changes. Patient was on admission to have some elevated blood pressure. Patient underwent a nuclear medicine stress test and walked 9 minutes on the Bruce protocol with no chest pain with no EKG changes. Images showed no evidence of ischemia. Cardiology was consulted who assessed the patient and recommended switching patient's atenolol to Coreg for more effective blood pressure control. It was felt per cardiology that patient was low risk from a cardiac standpoint and felt that his shortness of breath might be related to smoking, COPD, deconditioning, or hypertensive heart disease. She also recommended that patient may benefit from a sleep study. At time of discharge patient was in stable condition and 2-D echo was pending at the time which will need to be followed up upon. Patient with discharge in stable and improved condition.  #2 hypertension Patient was maintained on home regimen of Norvasc 10 mg daily as well as his Dyazide. Atenolol was held in anticipation of the stress test. Patient was seen in consultation by cardiology who recommended changing patient's atenolol to Coreg for better blood pressure control. Outpatient  follow-up.  #3 type 2 diabetes Remained stable. Oral hypoglycemic agents were held.  #4 dehydration Patient was hydrated with IV fluids and was euvolemic by day of discharge.  Procedures:  2-D echo pending  Nuclear medicine stress test  11/17/2015    Consultations:  Cardiology: Dr Martinique 11/17/2015  Discharge Exam: Filed Vitals:   11/17/15 1400 11/17/15 1641  BP: 107/85 114/68  Pulse: 72 72  Temp: 97.8 F (36.6 C)   Resp: 18     General: NAD Cardiovascular: RRR Respiratory: CTAB  Discharge Instructions   Discharge Instructions    Diet Carb Modified    Complete by:  As directed      Discharge instructions    Complete by:  As directed   fOLLOW UP WITH Simona Huh, MD IN 1-2 WEEKS.     Increase activity slowly    Complete by:  As directed           Current Discharge Medication List    START taking these medications   Details  carvedilol (COREG) 25 MG tablet Take 1 tablet (25 mg total) by mouth 2 (two) times daily with a meal. Qty: 60 tablet, Refills: 0      CONTINUE these medications which have NOT CHANGED   Details  amLODipine (NORVASC) 10 MG tablet Take 10 mg by mouth daily.    diclofenac (VOLTAREN) 75 MG EC tablet Take 75 mg by mouth daily.    metFORMIN (GLUCOPHAGE) 500 MG tablet Take 500 mg by mouth 2 (two) times daily with a meal.    triamterene-hydrochlorothiazide (DYAZIDE) 37.5-25 MG capsule Take 1 capsule by mouth daily.      STOP taking these medications     atenolol (TENORMIN) 100 MG tablet        No Known Allergies Follow-up Information    Follow up with Simona Huh, MD. Schedule an appointment as soon as possible for a visit in 1 week.   Specialty:  Family Medicine   Why:  F/U IN 1-2 WEEKS.   Contact information:   [address] [place] [address] [phone]        The results of significant diagnostics from this hospitalization (including imaging, microbiology, ancillary and laboratory) are listed below for reference.    Significant Diagnostic Studies: Dg Chest 2 View  11/16/2015  CLINICAL DATA:  Left-sided chest pain. Shortness of breath. Blurred vision. Hypertension. EXAM: CHEST  2 VIEW COMPARISON:  06/28/2012 FINDINGS: The heart  size and mediastinal contours are within normal limits. Mild scarring again seen in the left lung base. No evidence acute pulmonary infiltrate or edema. No evidence of pleural effusion. No evidence of pneumothorax. Mild thoracic spine degenerative changes again noted. IMPRESSION: No active cardiopulmonary disease. Electronically Signed   By: Earle Gell M.D.   On: 11/16/2015 18:01   Nm Myocar Multi W/spect W/wall Motion / Ef  11/17/2015  CLINICAL DATA:  Chest pain and shortness of breath.  Hypertension. EXAM: MYOCARDIAL IMAGING WITH SPECT (REST AND EXERCISE) GATED LEFT VENTRICULAR WALL MOTION STUDY LEFT VENTRICULAR EJECTION FRACTION TECHNIQUE: Standard myocardial SPECT imaging was performed after resting intravenous injection of 10 mCi Tc-23m sestamibi. Subsequently, exercise tolerance test was performed by the patient under the supervision of the Cardiology staff. At peak-stress, 30 mCi Tc-30m sestamibi was injected intravenously and standard myocardial SPECT imaging was performed. Quantitative gated imaging was also performed to evaluate left ventricular wall motion, and estimate left ventricular ejection fraction. COMPARISON:  None. FINDINGS: Perfusion: Moderate decreased activity is seen  in the inferior wall left ventricle on both stress and resting images. No corresponding wall motion abnormality demonstrated. This is most consistent with diaphragmatic attenuation artifact. No reversible myocardial perfusion defects are seen to suggest presence of inducible myocardial ischemia. Wall Motion: Normal left ventricular wall motion. No left ventricular dilation. Left Ventricular Ejection Fraction: 59 % End diastolic volume 123XX123 ml End systolic volume 42 ml IMPRESSION: 1. No reversible ischemia. Probable inferior wall diaphragmatic attenuation. 2. Normal left ventricular wall motion. 3. Left ventricular ejection fraction 59% 4. Low-risk stress test findings*. *2012 Appropriate Use Criteria for Coronary  Revascularization Focused Update: J Am Coll Cardiol. [id number]. [email or web address]://[email or web address] Electronically Signed   By: Earle Gell M.D.   On: 11/17/2015 16:27    Microbiology: No results found for this or any previous visit (from the past 240 hour(s)).   Labs: Basic Metabolic Panel:  Recent Labs Lab 11/16/15 1640 11/17/15 0145 11/17/15 1411  NA 140  --  138  K 4.2  --  3.9  CL 103  --  105  CO2 24  --  27  GLUCOSE 121*  --  100*  BUN 24*  --  15  CREATININE 1.64* 1.40* 1.20  CALCIUM 10.0  --  9.2  MG  --   --  2.3   Liver Function Tests: No results for input(s): AST, ALT, ALKPHOS, BILITOT, PROT, ALBUMIN in the last 168 hours. No results for input(s): LIPASE, AMYLASE in the last 168 hours. No results for input(s): AMMONIA in the last 168 hours. CBC:  Recent Labs Lab 11/16/15 1640 11/17/15 0145  WBC 7.3 5.9  HGB 13.2 11.9*  HCT 37.8* 33.9*  MCV 75.9* 76.5*  PLT 262 217   Cardiac Enzymes:  Recent Labs Lab 11/16/15 2100 11/17/15 0145  TROPONINI <0.03 <0.03   BNP: BNP (last 3 results)  Recent Labs  11/16/15 1920  BNP 14.0    ProBNP (last 3 results) No results for input(s): PROBNP in the last 8760 hours.  CBG:  Recent Labs Lab 11/17/15 1222 11/17/15 1656  GLUCAP 137* 114*       Signed:  Oscar Forman MD.  [facility] 11/17/2015, 5:21 PM

## 2015-11-17 NOTE — H&P (Signed)
[facility] History and Physical  AYHAM PEPE [id number] DOB: 1956/07/16 DOA: 11/16/2015  Referring physician: Dorise Bullion, M.D. PCP: Kathlene November, MD   Chief Complaint: Chest pain  HPI: SEGIO CARTER is a 60 y.o. male with a past medical history of hypertension, type 2 diabetes, osteoarthritis Comes to the emergency department due to precordial chest pain on and off for several days associated with dyspnea on exertion.  Per patient, over the past 2 weeks he has been feeling progressively short of breath. He states that usual activities, like going out to his mailbox or to pick up the newspaper, are getting him fatigued quickly. He has been noticing at work that he is getting higher quicker, and today noticed that he became short of breath walking from one building to another at work. In the past few days, he has had precordial stinging chest pain, non-radiated, associated with dyspnea, but denies nausea, diaphoresis, pitting edema lower extremity, PND orthopnea. He states that he occasionally feels dizzy, but this may not be related to his chest pain.  Workup in the ER shows a nonischemic EKG, negative troponin, negative d-dimer. He is currently chest pain-free. However, given all his risk factors, he will be admitted to telemetry for troponin levels cycling and evaluation by cardiology in a.m.  Review of Systems:  Constitutional:  Positive fatigue.  No weight loss, night sweats, Fevers, chills, HEENT:  No headaches, Difficulty swallowing,Tooth/dental problems,Sore throat,  No sneezing, itching, ear ache, nasal congestion, post nasal drip,  Cardio-vascular:  As earlier mentioned. GI:  No heartburn, indigestion, abdominal pain, nausea, vomiting, diarrhea, change in bowel habits, loss of appetite  Resp:  Dyspnea on exertion. Denies productive cough, wheezing or hemoptysis. Skin:  no rash or lesions.  GU:  no dysuria, change in color of urine, no urgency or frequency. No  flank pain.  Musculoskeletal:  Frequent arthralgias and back pain.  Psych:  No change in mood or affect. No depression or anxiety. No memory loss.   Past Medical History  Diagnosis Date  . Hypertension    History reviewed. No pertinent past surgical history. Social History:  reports that he has been smoking Cigarettes.  He has been smoking about 0.00 packs per day. He does not have any smokeless tobacco history on file. He reports that he does not drink alcohol or use illicit drugs.  No Known Allergies  Family History  Problem Relation Age of Onset  . Hypertension Mother   . Heart disease Mother     CHF  . Stroke Father   . Hypertension Father   . Diabetes Mellitus II Father   . Hypertension Brother   . Diabetes Mellitus II Brother   . Heart disease Brother     Prostate Cancer  . Diabetes Mellitus II Brother   . Hypertension Brother     Prior to Admission medications   Medication Sig Start Date End Date Taking? Authorizing Provider  amLODipine (NORVASC) 10 MG tablet Take 10 mg by mouth daily.   Yes Historical Provider, MD  atenolol (TENORMIN) 100 MG tablet Take 100 mg by mouth daily.   Yes Historical Provider, MD  diclofenac (VOLTAREN) 75 MG EC tablet Take 75 mg by mouth daily.   Yes Historical Provider, MD  metFORMIN (GLUCOPHAGE) 500 MG tablet Take 500 mg by mouth 2 (two) times daily with a meal.   Yes Historical Provider, MD  triamterene-hydrochlorothiazide (DYAZIDE) 37.5-25 MG capsule Take 1 capsule by mouth daily.   Yes Historical Provider, MD  Physical Exam: Filed Vitals:   11/16/15 2145 11/16/15 2230 11/16/15 2245 11/17/15 0000  BP: 138/89 119/85 126/94 125/91  Pulse: 64 63 60 73  Temp:    98.2 F (36.8 C)  TempSrc:      Resp: 16 15 18 18   Height:    6\' 1"  (1.854 m)  Weight:    89.041 kg (196 lb 4.8 oz)  SpO2: 99% 99% 99% 99%    Wt Readings from Last 3 Encounters:  11/17/15 89.041 kg (196 lb 4.8 oz)    General:  Appears calm and comfortable Eyes:  PERRL, normal lids, irises & conjunctiva ENT: grossly normal hearing, lips & tongue Neck: no LAD, masses or thyromegaly Cardiovascular: RRR, no m/r/g. No LE edema. Telemetry: SR, no arrhythmias  Respiratory: CTA bilaterally, no w/r/r. Normal respiratory effort. Abdomen: soft, ntnd Skin: no rash or induration seen on limited exam Musculoskeletal: grossly normal tone BUE/BLE Psychiatric: grossly normal mood and affect, speech fluent and appropriate Neurologic: grossly non-focal.          Labs on Admission:  Basic Metabolic Panel:  Recent Labs Lab 11/16/15 1640 11/17/15 0145  NA 140  --   K 4.2  --   CL 103  --   CO2 24  --   GLUCOSE 121*  --   BUN 24*  --   CREATININE 1.64* 1.40*  CALCIUM 10.0  --    CBC:  Recent Labs Lab 11/16/15 1640 11/17/15 0145  WBC 7.3 5.9  HGB 13.2 11.9*  HCT 37.8* 33.9*  MCV 75.9* 76.5*  PLT 262 217   Cardiac Enzymes:  Recent Labs Lab 11/16/15 2100 11/17/15 0145  TROPONINI <0.03 <0.03    BNP (last 3 results)  Recent Labs  11/16/15 1920  BNP 14.0    Radiological Exams on Admission: Dg Chest 2 View  11/16/2015  CLINICAL DATA:  Left-sided chest pain. Shortness of breath. Blurred vision. Hypertension. EXAM: CHEST  2 VIEW COMPARISON:  06/28/2012 FINDINGS: The heart size and mediastinal contours are within normal limits. Mild scarring again seen in the left lung base. No evidence acute pulmonary infiltrate or edema. No evidence of pleural effusion. No evidence of pneumothorax. Mild thoracic spine degenerative changes again noted. IMPRESSION: No active cardiopulmonary disease. Electronically Signed   By: Earle Gell M.D.   On: 11/16/2015 18:01    EKG: Independently reviewed. Vent. rate 89 BPM PR interval 178 ms QRS duration 84 ms QT/QTc 372/452 ms P-R-T axes 73 17 55 Normal sinus rhythm. Voltage criteria for LVH Otherwise Normal EKG. No previous EKG to compare to.  Assessment/Plan Principal Problem:   Chest pain Multiple  risk factors for CAD. Admit to telemetry. Serial troponin levels. Aspirin 325 mg by mouth daily. Cardiology evaluation in the morning for possible stress testing.  Active Problems:   Essential hypertension Continue amlodipine 10 mg by mouth daily Continue Dyazide 30 7.5-25 milligrams by mouth daily. Hold atenolol in anticipation with stress testing in the morning.    Dyspnea Continue supplemental oxygen. Cardiology evaluation in a.m.    Type 2 diabetes mellitus (HCC) Hold metformin. Carbohydrate modified diet. CBG monitoring before meals.    Dehydration Hold metformin due to elevated creatinine. I will hydrate overnight in anticipation of no clear medicine stress testing.    Cardiology was consulted.   Code Status: Full code. DVT Prophylaxis: Lovenox SQ. Family Communication: His wife was by bedside. Disposition Plan: Admit to telemetry and trend troponin levels. Further workup per cardiology.  Time spent: About 70 minutes  were spent in the process of this admission.  Reubin Milan [facility] Pager [phone].

## 2015-11-17 NOTE — Consult Note (Signed)
CARDIOLOGY CONSULT NOTE   Patient ID: Daniel Yoder MRN: [id number] DOB/AGE: January 29, 1956 60 y.o.  Admit Date: 11/16/2015 Referring Physician: [facility] Primary Physician: Daniel November, MD Consulting Cardiologist: Daniel Bickers MD Primary Cardiologist: Daniel Yoder Reason for Consultation: Chest Pain with multiple CVRF  Clinical Summary Daniel Yoder is a 60 y.o.male with known history of WPW diagnosed in 2003 with ablation, hypertension, Type II diabetes, who presented to ER with complaints of chest pain and hypertension. He has had complaints of fatigue and some chest pressure for about 3-4 weeks, but his wife noticed it longer.He is followed by [facility] at [facility] who has adjusted his hypertensive medications. He states they were doing fine until about 3-4 weeks ago when he noticed his BP had been rising. He has been under a lot of pressure at work over the last few months as well.   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 They sent him down to health dept at his work site. He was found to have significantly elevated BP of 180/103. They called his wife who called his PCP. They directed him to ER.   . On arrival to ER, BP 155/101, HR 87 bpm. Afebrile. Glucose 121, BUN 24, Creatinine 1.64  CXR was negative for CHF or pneumonia. EKG NSR rate of 89 bpm. Troponin was cycled and found to be negative X 2. Nuclear MPI was completed this am with scintigraphy results pending. He had hypertensive response to exercise with associated dizziness and dyspnea. Echo has not been completed.    No Known Allergies  Medications Scheduled Medications: . amLODipine  10 mg Oral QHS  . aspirin EC  325 mg Oral Daily  . diclofenac  75 mg Oral Daily  . enoxaparin (LOVENOX) injection  40 mg Subcutaneous Q24H  . sodium chloride  3 mL Intravenous Q12H  . triamterene-hydrochlorothiazide  1 capsule Oral QHS    Infusions: . sodium chloride 10  mL/hr (11/17/15 1120)    PRN Medications: acetaminophen, ALPRAZolam, ondansetron **OR** ondansetron (ZOFRAN) IV   Past Medical History  Diagnosis Date  . Hypertension   . WPW (Wolff-Parkinson-White syndrome) 2003    Ablation 2003    History reviewed. No pertinent past surgical history.  Family History  Problem Relation Age of Onset  . Hypertension Mother   . Heart disease Mother     CHF  . Stroke Father   . Hypertension Father   . Diabetes Mellitus II Father   . Hypertension Brother   . Diabetes Mellitus II Brother   . Heart disease Brother     Prostate Cancer  . Diabetes Mellitus II Brother   . Hypertension Brother     Social History Daniel Yoder reports that he has been smoking Cigarettes.  He has been smoking about 0.00 packs per day for the past 30 years. He does not have any smokeless tobacco history on file. Daniel Yoder reports that he does not drink alcohol.  Review of Systems Complete review of systems are found to be negative unless outlined in H&P above.  Physical Examination Blood pressure 128/89, pulse 77, temperature 98 F (36.7 C), temperature source Oral, resp. rate 20, height 6\' 1"  (1.854 m), weight 196 lb 4.8 oz (89.041 kg), SpO2 98 %.  Intake/Output Summary (Last 24 hours) at 11/17/15 1407 Last data filed at 11/17/15 1037  Gross per 24 hour  Intake   2200 ml  Output  925 ml  Net   1275 ml    Telemetry:  GEN: HEENT: Conjunctiva and lids normal, oropharynx clear with moist mucosa. Neck: Supple, no elevated JVP or carotid bruits, no thyromegaly. Lungs: Clear to auscultation, nonlabored breathing at rest. Cardiac: Regular rate and rhythm, no S3 or significant systolic murmur, no pericardial rub. Abdomen: Soft, nontender, no hepatomegaly, bowel sounds present, no guarding or rebound. Extremities: No pitting edema, distal pulses 2+. Skin: Warm and dry. Musculoskeletal: No kyphosis. Neuropsychiatric: Alert and oriented x3, affect grossly  appropriate.  Prior Cardiac Testing/Procedures 1. Cardiac Cath FINDINGS: 1. Left ventricle: 118/11/18. Ejection fraction 65% without regional wall  motion abnormality. 2. No aortic stenosis or mitral regurgitation. 3. Left main: Angiographically normal. 4. Left anterior descending: Large vessel which wraps around the apex of  the heart to supply a large portion of the inferior wall. It gives rise  to a single diagonal branch. The vessel is angiographically normal. 5. Ramus intermedius: Small vessel which is angiographically normal. 6. Circumflex: Moderate size vessel giving rise to two obtuse marginal's.  It is angiographically normal. 7. Right coronary artery: Small but technically dominant vessel which is  angiographically normal.  2. Ablation 03/04/2002-[facility] CONCLUSION: This study demonstrates successful electrophysiologic study and RF catheter ablation of inducible AV reentrant tachycardia with manifest by WPW syndrome. There were no immediate procedural complications.Dictated by: Champ Mungo. Lovena Le, M.D. [facility]  3. Echocardiogram: 01/30/2003  SUMMARY - Overall left ventricular systolic function was normal. Left    ventricular ejection fraction was estimated , range being 55    % to 65 %. There were no left ventricular regional wall    motion abnormalities. - There was mild fibrocalcific change of the aortic root. - There was mild mitral annular calcification. - The left atrium was mildly dilated.  Lab Results  Basic Metabolic Panel:  Recent Labs Lab 11/16/15 1640 11/17/15 0145  NA 140  --   K 4.2  --   CL 103  --   CO2 24  --   GLUCOSE 121*  --   BUN 24*  --   CREATININE 1.64* 1.40*  CALCIUM 10.0  --    CBC:  Recent Labs Lab 11/16/15 1640 11/17/15 0145  WBC 7.3 5.9  HGB 13.2 11.9*  HCT 37.8* 33.9*  MCV 75.9* 76.5*  PLT 262 217    Cardiac Enzymes:  Recent Labs Lab 11/16/15 2100 11/17/15 0145   TROPONINI <0.03 <0.03     Radiology: Dg Chest 2 View  11/16/2015  CLINICAL DATA:  Left-sided chest pain. Shortness of breath. Blurred vision. Hypertension. EXAM: CHEST  2 VIEW COMPARISON:  06/28/2012 FINDINGS: The heart size and mediastinal contours are within normal limits. Mild scarring again seen in the left lung base. No evidence acute pulmonary infiltrate or edema. No evidence of pleural effusion. No evidence of pneumothorax. Mild thoracic spine degenerative changes again noted. IMPRESSION: No active cardiopulmonary disease. Electronically Signed   By: Earle Gell M.D.   On: 11/16/2015 18:01     ECG: NSR rate of 85 bpm.   Impression and Recommendations  1. Hypertension: More difficult to control over the last 3-4 weeks. He is on amlodipine, atenolol and dyazide at home. He is on amlodipine 10 mg now with Dyazide while hospitalized. BP has been controled. Echocardiogram is pending results.  Patient's wife states that he snores heavily but does not know if he stops breathing,   2. Chest Pain: No evidence of ACS by troponin or EKG. He has some pressure  and BP elevation with stress test, but scintigraphy is pending. Multiple CVRF to include diabetes, hypertension, age, male, tobacco abuse,unknown cholesterol status.   3. Diabetes: Check Hgb A1c. Consider ACE /ARB in the setting of diabetes for renal protection. Creatinine is 1.40 this am.   4. Tobacco abuse: States he smokes "casually" for over 30 years.  Signed: Phill Myron. Lawrence NP [place]  11/17/2015, 2:07 PM Co-Sign MD  Patient seen and examined and history reviewed. Agree with above findings and plan. 60 yo WM referred to ED for elevated BP. Notes increased fatigue and dyspnea on exertion for the last 2 weeks. Only mild atypical chest pain. Wife has noted more fatigue for a longer period of time and thinks his work changes are too stressful for him. He is a chronic smoker--but currently only smokes a few cigarettes a week. Reports  BP has been under control recently until this episode at work. Prior Echo and cardiac cath in 2004 were normal.   Exam reveals clear lungs. No JVD. No gallop or murmur. No edema. Labs show normal CXR and Ecg. Normal troponin, BNP, and d-dimer.  On stress myoview this am walked 9 minutes on Bruce protocol. No chest pain or Ecg changes. Official report pending. I reviewed images and see no evidence of ischemia. There is mild inferior attenuation probably due to diaphragmatic attenuation.   Echo is pending.   I think he is low risk from a cardiac standpoint. Dyspnea may be related to smoking, COPD, deconditioning, or hypertensive heart disease. Also consider sleep apnea. Wife reports that he snores and stays tired a lot.  Recommendations: Switch atenolol to Coreg 25 mg bid for more effective BP control.  Smoking cessation. Regular aerobic exercise Consider screening for sleep apnea- will defer to primary care. Follow up on Echo  Check lipids.  Peter Martinique, [place] 11/17/2015 3:10 PM

## 2015-11-18 ENCOUNTER — Observation Stay (HOSPITAL_BASED_OUTPATIENT_CLINIC_OR_DEPARTMENT_OTHER): Payer: 59

## 2015-11-18 DIAGNOSIS — E119 Type 2 diabetes mellitus without complications: Secondary | ICD-10-CM

## 2015-11-18 DIAGNOSIS — R079 Chest pain, unspecified: Secondary | ICD-10-CM

## 2015-11-18 DIAGNOSIS — R0600 Dyspnea, unspecified: Secondary | ICD-10-CM | POA: Diagnosis not present

## 2015-11-18 DIAGNOSIS — I10 Essential (primary) hypertension: Secondary | ICD-10-CM | POA: Diagnosis not present

## 2015-11-18 DIAGNOSIS — E860 Dehydration: Secondary | ICD-10-CM | POA: Diagnosis not present

## 2015-11-18 LAB — GLUCOSE, CAPILLARY
Glucose-Capillary: 109 mg/dL — ABNORMAL HIGH (ref 65–99)
Glucose-Capillary: 102 mg/dL — ABNORMAL HIGH (ref 65–99)

## 2015-11-18 LAB — BASIC METABOLIC PANEL
Anion gap: 7 (ref 5–15)
BUN: 19 mg/dL (ref 6–20)
CHLORIDE: 105 mmol/L (ref 101–111)
CO2: 29 mmol/L (ref 22–32)
CREATININE: 1.39 mg/dL — AB (ref 0.61–1.24)
Calcium: 9.1 mg/dL (ref 8.9–10.3)
GFR calc Af Amer: >60 mL/min (ref >60)
GFR calc non Af Amer: 55 mL/min — ABNORMAL LOW (ref >60)
GLUCOSE: 98 mg/dL (ref 65–99)
Potassium: 4.1 mmol/L (ref 3.5–5.1)
SODIUM: 141 mmol/L (ref 135–145)

## 2015-11-18 MED ORDER — AMLODIPINE BESYLATE 5 MG PO TABS: 5.0000 mg | ORAL_TABLET | Freq: Every day | ORAL | Status: DC

## 2015-11-18 NOTE — Progress Notes (Signed)
Patient alert oriented, pt. Denies pain, no c/o shortness of breath.iv and tele d/c, d/c instruction explain and given to the patient, all questions answered, pt. Verbalized understanding. Pt. D/c home per order.

## 2015-11-18 NOTE — Progress Notes (Addendum)
Consulting cardiologist: Martinique, Peter MD Primary Cardiologist: Sanjuan Dame, MD  Cardiology Specific Problem List: 1. Hypertension 2. Chest Pain  Subjective:    Did not go home as echo was not completed.   Objective:   Temp:  [97.7 F (36.5 C)-98 F (36.7 C)] 97.7 F (36.5 C) (01/15 0420) Pulse Rate:  [64-131] 66 (01/15 0420) Resp:  [16-20] 18 (01/15 0420) BP: (107-231)/(68-101) 133/87 mmHg (01/15 0420) SpO2:  [98 %-100 %] 99 % (01/15 0420) Weight:  [197 lb 8 oz (89.585 kg)] 197 lb 8 oz (89.585 kg) (01/15 0420) Last BM Date: 11/16/15  Filed Weights   11/16/15 1835 11/17/15 0000 11/18/15 0420  Weight: 203 lb (92.08 kg) 196 lb 4.8 oz (89.041 kg) 197 lb 8 oz (89.585 kg)    Intake/Output Summary (Last 24 hours) at 11/18/15 0918 Last data filed at 11/18/15 0900  Gross per 24 hour  Intake 2636.25 ml  Output   1800 ml  Net 836.25 ml    Telemetry: NSR  Exam:  General: No acute distress.  HEENT: Conjunctiva and lids normal, oropharynx clear.  Lungs: Clear to auscultation, nonlabored.  Cardiac: No elevated JVP or bruits. RRR, no gallop or rub.   Abdomen: Normoactive bowel sounds, nontender, nondistended.  Extremities: No pitting edema, distal pulses full.  Neuropsychiatric: Alert and oriented x3, affect appropriate.   Lab Results:  Basic Metabolic Panel:  Recent Labs Lab 11/16/15 1640 11/17/15 0145 11/17/15 1411 11/18/15 0305  NA 140  --  138 141  K 4.2  --  3.9 4.1  CL 103  --  105 105  CO2 24  --  27 29  GLUCOSE 121*  --  100* 98  BUN 24*  --  15 19  CREATININE 1.64* 1.40* 1.20 1.39*  CALCIUM 10.0  --  9.2 9.1  MG  --   --  2.3  --      Recent Labs Lab 11/16/15 1640 11/17/15 0145  WBC 7.3 5.9  HGB 13.2 11.9*  HCT 37.8* 33.9*  MCV 75.9* 76.5*  PLT 262 217    Cardiac Enzymes:  Recent Labs Lab 11/16/15 2100 11/17/15 0145  TROPONINI <0.03 <0.03   Coagulation:  Recent Labs Lab 11/17/15 0145  INR 1.06    Radiology: Dg  Chest 2 View  11/16/2015  CLINICAL DATA:  Left-sided chest pain. Shortness of breath. Blurred vision. Hypertension. EXAM: CHEST  2 VIEW COMPARISON:  06/28/2012 FINDINGS: The heart size and mediastinal contours are within normal limits. Mild scarring again seen in the left lung base. No evidence acute pulmonary infiltrate or edema. No evidence of pleural effusion. No evidence of pneumothorax. Mild thoracic spine degenerative changes again noted. IMPRESSION: No active cardiopulmonary disease. Electronically Signed   By: Earle Gell M.D.   On: 11/16/2015 18:01   Nm Myocar Multi W/spect W/wall Motion / Ef  11/17/2015  CLINICAL DATA:  Chest pain and shortness of breath.  Hypertension. EXAM: MYOCARDIAL IMAGING WITH SPECT (REST AND EXERCISE) GATED LEFT VENTRICULAR WALL MOTION STUDY LEFT VENTRICULAR EJECTION FRACTION TECHNIQUE: Standard myocardial SPECT imaging was performed after resting intravenous injection of 10 mCi Tc-76m sestamibi. Subsequently, exercise tolerance test was performed by the patient under the supervision of the Cardiology staff. At peak-stress, 30 mCi Tc-39m sestamibi was injected intravenously and standard myocardial SPECT imaging was performed. Quantitative gated imaging was also performed to evaluate left ventricular wall motion, and estimate left ventricular ejection fraction. COMPARISON:  None. FINDINGS: Perfusion: Moderate decreased activity is seen in the inferior  wall left ventricle on both stress and resting images. No corresponding wall motion abnormality demonstrated. This is most consistent with diaphragmatic attenuation artifact. No reversible myocardial perfusion defects are seen to suggest presence of inducible myocardial ischemia. Wall Motion: Normal left ventricular wall motion. No left ventricular dilation. Left Ventricular Ejection Fraction: 59 % End diastolic volume 123XX123 ml End systolic volume 42 ml IMPRESSION: 1. No reversible ischemia. Probable inferior wall diaphragmatic  attenuation. 2. Normal left ventricular wall motion. 3. Left ventricular ejection fraction 59% 4. Low-risk stress test findings*. *2012 Appropriate Use Criteria for Coronary Revascularization Focused Update: J Am Coll Cardiol. N6492421. http://content.airportbarriers.com.aspx?articleid=1201161 Electronically Signed   By: Earle Gell M.D.   On: 11/17/2015 16:27      Medications:   Scheduled Medications: . amLODipine  5 mg Oral QHS  . aspirin EC  325 mg Oral Daily  . carvedilol  25 mg Oral BID WC  . diclofenac  75 mg Oral Daily  . enoxaparin (LOVENOX) injection  40 mg Subcutaneous Q24H  . sodium chloride  3 mL Intravenous Q12H  . triamterene-hydrochlorothiazide  1 capsule Oral QHS    Infusions: . sodium chloride 10 mL/hr at 11/17/15 1400    PRN Medications: acetaminophen, ALPRAZolam, ondansetron **OR** ondansetron (ZOFRAN) IV   Assessment and Plan:   1. Hypertension:  BP much better controlled with the addition of coreg 25 mg BID. Amlodipine reduced to 5 mg daily, but may need to go back up once he is back to work. Will make appointment with our office for post-hospital follow up and discussion of echo results. Can go home from cardiology standpoint. Needs DASH diet instructions.   2. Chest Pain: Resolved. Stress test is negative.   Phill Myron. Lawrence NP [place]  11/18/2015, 9:18 AM   Agree with note by Angelena Form NP  MV neg. 2D pending. Enz neg. Exam benign. SCr mildly elevated. BP under better control. Can prob be D/Cd home. F/U as OP with Dr. Martinique.    Lorretta Harp, M.D., [place], [facility], [place] [address]. [place], [place]  [address]  [phone] 11/18/2015 10:56 AM

## 2015-11-18 NOTE — Progress Notes (Signed)
  Echocardiogram 2D Echocardiogram has been performed.  Daniel Yoder 11/18/2015, 8:44 AM

## 2015-11-18 NOTE — Progress Notes (Signed)
[facility] PROGRESS NOTE  Daniel Yoder [id number] DOB: 1956-05-05 DOA: 11/16/2015 PCP: Simona Huh, MD  Assessment/Plan: #1 chest pain/Dyspnea Patient was admitted with chest pain and shortness of breath and due to his multiple cardiac risk factors cardiac workup was undertaken. Cardiac enzymes were cycled which were negative 3. EKG had no ischemic changes. Patient was on admission to have some elevated blood pressure. Patient underwent a nuclear medicine stress test and walked 9 minutes on the Bruce protocol with no chest pain with no EKG changes. Images showed no evidence of ischemia. Cardiology was consulted who assessed the patient and recommended switching patient's atenolol to Coreg for more effective blood pressure control. It was felt per cardiology that patient was low risk from a cardiac standpoint and felt that his shortness of breath might be related to smoking, COPD, deconditioning, or hypertensive heart disease. She also recommended that patient may benefit from a sleep study. At time of discharge patient is in stable condition and 2-D echo is pending at the time which will need to be followed up upon.   #2 hypertension Patient was maintained on home regimen of Norvasc 10 mg daily as well as his Dyazide. Atenolol was held in anticipation of the stress test. Patient was seen in consultation by cardiology who recommended changing patient's atenolol to Coreg for better blood pressure control. Norvasc has been decreased to 5 mg daily per cardiology. Outpatient follow-up.  #3 type 2 diabetes Remained stable. Sliding scale insulin. Resume oral hypoglycemic agents on discharge.  #4 dehydration Patient was hydrated with IV fluids and was euvolemic by day of discharge.   Code Status: Full Family Communication: Updated patient. No family at bedside. Disposition Plan: Discharge home.   Consultants:  Cardiology: Dr Martinique 11/17/2015  Procedures:  Nuclear medicine  stress test 11/17/2015  2-D echo 11/18/2015  Antibiotics:  None  HPI/Subjective: Patient denies any chest pain. No shortness of breath. Feels better.  Objective: Filed Vitals:   11/17/15 2347 11/18/15 0420  BP: 111/75 133/87  Pulse: 64 66  Temp: 98 F (36.7 C) 97.7 F (36.5 C)  Resp: 16 18    Intake/Output Summary (Last 24 hours) at 11/18/15 1117 Last data filed at 11/18/15 0900  Gross per 24 hour  Intake 1436.25 ml  Output   1800 ml  Net -363.75 ml   Filed Weights   11/16/15 1835 11/17/15 0000 11/18/15 0420  Weight: 92.08 kg (203 lb) 89.041 kg (196 lb 4.8 oz) 89.585 kg (197 lb 8 oz)    Exam:   General:  NAD  Cardiovascular: RRR  Respiratory: CTAB  Abdomen: Soft/NT/ND/+BS  Musculoskeletal: No c/c/e   Data Reviewed: Basic Metabolic Panel:  Recent Labs Lab 11/16/15 1640 11/17/15 0145 11/17/15 1411 11/18/15 0305  NA 140  --  138 141  K 4.2  --  3.9 4.1  CL 103  --  105 105  CO2 24  --  27 29  GLUCOSE 121*  --  100* 98  BUN 24*  --  15 19  CREATININE 1.64* 1.40* 1.20 1.39*  CALCIUM 10.0  --  9.2 9.1  MG  --   --  2.3  --    Liver Function Tests: No results for input(s): AST, ALT, ALKPHOS, BILITOT, PROT, ALBUMIN in the last 168 hours. No results for input(s): LIPASE, AMYLASE in the last 168 hours. No results for input(s): AMMONIA in the last 168 hours. CBC:  Recent Labs Lab 11/16/15 1640 11/17/15 0145  WBC 7.3 5.9  HGB  13.2 11.9*  HCT 37.8* 33.9*  MCV 75.9* 76.5*  PLT 262 217   Cardiac Enzymes:  Recent Labs Lab 11/16/15 2100 11/17/15 0145  TROPONINI <0.03 <0.03   BNP (last 3 results)  Recent Labs  11/16/15 1920  BNP 14.0    ProBNP (last 3 results) No results for input(s): PROBNP in the last 8760 hours.  CBG:  Recent Labs Lab 11/17/15 1222 11/17/15 1656 11/17/15 2055 11/18/15 0617  GLUCAP 137* 114* 127* 109*    No results found for this or any previous visit (from the past 240 hour(s)).   Studies: Dg Chest 2  View  11/16/2015  CLINICAL DATA:  Left-sided chest pain. Shortness of breath. Blurred vision. Hypertension. EXAM: CHEST  2 VIEW COMPARISON:  06/28/2012 FINDINGS: The heart size and mediastinal contours are within normal limits. Mild scarring again seen in the left lung base. No evidence acute pulmonary infiltrate or edema. No evidence of pleural effusion. No evidence of pneumothorax. Mild thoracic spine degenerative changes again noted. IMPRESSION: No active cardiopulmonary disease. Electronically Signed   By: Earle Gell M.D.   On: 11/16/2015 18:01   Nm Myocar Multi W/spect W/wall Motion / Ef  11/17/2015  CLINICAL DATA:  Chest pain and shortness of breath.  Hypertension. EXAM: MYOCARDIAL IMAGING WITH SPECT (REST AND EXERCISE) GATED LEFT VENTRICULAR WALL MOTION STUDY LEFT VENTRICULAR EJECTION FRACTION TECHNIQUE: Standard myocardial SPECT imaging was performed after resting intravenous injection of 10 mCi Tc-6m sestamibi. Subsequently, exercise tolerance test was performed by the patient under the supervision of the Cardiology staff. At peak-stress, 30 mCi Tc-4m sestamibi was injected intravenously and standard myocardial SPECT imaging was performed. Quantitative gated imaging was also performed to evaluate left ventricular wall motion, and estimate left ventricular ejection fraction. COMPARISON:  None. FINDINGS: Perfusion: Moderate decreased activity is seen in the inferior wall left ventricle on both stress and resting images. No corresponding wall motion abnormality demonstrated. This is most consistent with diaphragmatic attenuation artifact. No reversible myocardial perfusion defects are seen to suggest presence of inducible myocardial ischemia. Wall Motion: Normal left ventricular wall motion. No left ventricular dilation. Left Ventricular Ejection Fraction: 59 % End diastolic volume 123XX123 ml End systolic volume 42 ml IMPRESSION: 1. No reversible ischemia. Probable inferior wall diaphragmatic attenuation.  2. Normal left ventricular wall motion. 3. Left ventricular ejection fraction 59% 4. Low-risk stress test findings*. *2012 Appropriate Use Criteria for Coronary Revascularization Focused Update: J Am Coll Cardiol. [id number]. http://content.airportbarriers.com.aspx?articleid=[email or web address] Electronically Signed   By: Earle Gell M.D.   On: 11/17/2015 16:27    Scheduled Meds: . amLODipine  5 mg Oral QHS  . aspirin EC  325 mg Oral Daily  . carvedilol  25 mg Oral BID WC  . diclofenac  75 mg Oral Daily  . enoxaparin (LOVENOX) injection  40 mg Subcutaneous Q24H  . sodium chloride  3 mL Intravenous Q12H  . triamterene-hydrochlorothiazide  1 capsule Oral QHS   Continuous Infusions: . sodium chloride 10 mL/hr at 11/17/15 1400    Principal Problem:   Chest pain Active Problems:   Essential hypertension   Dyspnea   Type 2 diabetes mellitus ([place])   Dehydration    Time spent: 35 mins    [facility] MD [facility] Pager [phone]. If 7PM-7AM, please contact night-coverage at [email or web address], password [facility] 11/18/2015, 11:17 AM

## 2015-11-19 ENCOUNTER — Telehealth: Payer: Self-pay | Admitting: Cardiology

## 2015-11-19 ENCOUNTER — Telehealth: Payer: Self-pay

## 2015-11-19 DIAGNOSIS — I499 Cardiac arrhythmia, unspecified: Secondary | ICD-10-CM

## 2015-11-19 LAB — HEMOGLOBIN A1C
Hgb A1c MFr Bld: 6.2 % — ABNORMAL HIGH (ref 4.8–5.6)
Hgb A1c MFr Bld: 6.3 % — ABNORMAL HIGH (ref 4.8–5.6)
Mean Plasma Glucose: 131 mg/dL
Mean Plasma Glucose: 134 mg/dL

## 2015-11-19 NOTE — Telephone Encounter (Signed)
Patient came to office and had EKG No c/o skipped beats at this time EKG reviewed with Dr. Martinique Advised patient that if he has sustained skipped beats or has has symptoms such as shortness of breath, chest pain or feeling faint to call office or seek medical care

## 2015-11-19 NOTE — Telephone Encounter (Signed)
Daniel Yoder is calling because Daniel Yoder because he was taking his blood pressure and per monitor it shows that he is having a irregular heart beat . It would beat Then stop. Please call    HR 76-80 BP 137/92 Can feel an irreg beat several times over past 1/2 hour Does not have any shortness of breath or pain Will come by some time today for an EKG

## 2015-11-19 NOTE — Telephone Encounter (Signed)
Just discharged from hospital 2 days ago. Had normal stress test and Echo. No arrhythmia in hospital. Pretty low risk. If he wants to get an Ecg that's fine.  Peter Martinique MD, [facility]

## 2015-11-19 NOTE — Telephone Encounter (Signed)
[name] is calling because [name] because he was taking his blood pressure and per monitor it shows that he is having a irregular heart beat . It would beat  Then stop. Please call   Thanks

## 2015-11-19 NOTE — Telephone Encounter (Signed)
EKG complete, results to Dr. Martinique.  Patient without feeling of  a irreg beat. Advised patient that everything is ok That if he were to continue with irregular rhythm continuously he needs to call us

## 2015-11-29 ENCOUNTER — Encounter: Payer: Self-pay | Admitting: Cardiology

## 2015-11-29 ENCOUNTER — Ambulatory Visit (INDEPENDENT_AMBULATORY_CARE_PROVIDER_SITE_OTHER): Payer: 59 | Admitting: Cardiology

## 2015-11-29 VITALS — BP 146/98 | HR 88 | Ht 73.0 in | Wt 204.0 lb

## 2015-11-29 DIAGNOSIS — I10 Essential (primary) hypertension: Secondary | ICD-10-CM

## 2015-11-29 DIAGNOSIS — I456 Pre-excitation syndrome: Secondary | ICD-10-CM | POA: Diagnosis not present

## 2015-11-29 DIAGNOSIS — N183 Chronic kidney disease, stage 3 unspecified: Secondary | ICD-10-CM

## 2015-11-29 DIAGNOSIS — I5189 Other ill-defined heart diseases: Secondary | ICD-10-CM | POA: Insufficient documentation

## 2015-11-29 DIAGNOSIS — I519 Heart disease, unspecified: Secondary | ICD-10-CM

## 2015-11-29 DIAGNOSIS — E1122 Type 2 diabetes mellitus with diabetic chronic kidney disease: Secondary | ICD-10-CM

## 2015-11-29 DIAGNOSIS — E785 Hyperlipidemia, unspecified: Secondary | ICD-10-CM | POA: Insufficient documentation

## 2015-11-29 DIAGNOSIS — N189 Chronic kidney disease, unspecified: Secondary | ICD-10-CM | POA: Diagnosis not present

## 2015-11-29 DIAGNOSIS — R079 Chest pain, unspecified: Secondary | ICD-10-CM

## 2015-11-29 DIAGNOSIS — R002 Palpitations: Secondary | ICD-10-CM | POA: Insufficient documentation

## 2015-11-29 MED ORDER — AMLODIPINE BESYLATE 10 MG PO TABS: 10.0000 mg | ORAL_TABLET | Freq: Every day | ORAL | Status: DC

## 2015-11-29 NOTE — Assessment & Plan Note (Signed)
Admitted with accelerated HTN Jan 2017

## 2015-11-29 NOTE — Assessment & Plan Note (Signed)
SCr 1.2-1.6 during hospitalization in Jan 2017

## 2015-11-29 NOTE — Patient Instructions (Signed)
Your physician has recommended you make the following change in your medication: INCREASE AMLODIPINE TO 10MG  DAILY  Your physician recommends that you schedule a follow-up appointment in: AS NEEDED

## 2015-11-29 NOTE — Assessment & Plan Note (Signed)
He has noted irregular heart beats registered on his home B/P monitor.

## 2015-11-29 NOTE — Assessment & Plan Note (Signed)
SCr elevated during his recent hospoitalization

## 2015-11-29 NOTE — Assessment & Plan Note (Signed)
Mild, with normal LVF by echo 11/18/15

## 2015-11-29 NOTE — Assessment & Plan Note (Signed)
S/P RFA 2003 

## 2015-11-29 NOTE — Progress Notes (Signed)
11/29/2015 Daniel Yoder   21-Oct-1956  [id number]  Primary Physician Simona Huh, MD Primary Cardiologist: Dr Martinique  HPI:  60 y/o AA male with a history of WPW s/p RFA 2003 at [facility], HTN,and NIDDM. He was recently admitted to [facility] 1//14/17 with accelerated HTN and palpitations. A Myoview was low risk. Echo showed normal LVF with mild LVH. His Atenolol was changed to Coreg and his Norvasc decreased to 5 mg. He is in the office today for follow up. He brought in B/P readings from home- he takes his B/P twice a day. His diastolic has been creeping up since discharge and is now averaging around 90. He was also concerned because his B/P machine had noted "irregular rhythm". He describes occasional skipped beats, no sustained tachycardia.     Current Outpatient Prescriptions  Medication Sig Dispense Refill  . amLODipine (NORVASC) 5 MG tablet Take 1 tablet (5 mg total) by mouth at bedtime. 30 tablet 3  . carvedilol (COREG) 25 MG tablet Take 1 tablet (25 mg total) by mouth 2 (two) times daily with a meal. 60 tablet 1  . diclofenac (VOLTAREN) 75 MG EC tablet Take 75 mg by mouth daily.    . metFORMIN (GLUCOPHAGE) 500 MG tablet Take 500 mg by mouth 2 (two) times daily with a meal.    . triamterene-hydrochlorothiazide (DYAZIDE) 37.5-25 MG capsule Take 1 capsule by mouth daily.     No current facility-administered medications for this visit.    No Known Allergies  Social History   Social History  . Marital Status: Married    Spouse Name: N/A  . Number of Children: N/A  . Years of Education: N/A   Occupational History  . Not on file.   Social History Main Topics  . Smoking status: Current Every Day Smoker -- 0.00 packs/day for 30 years    Types: Cigarettes  . Smokeless tobacco: Not on file  . Alcohol Use: No  . Drug Use: No  . Sexual Activity: Yes   Other Topics Concern  . Not on file   Social History Narrative     Review of Systems: General: negative for chills, fever,  night sweats or weight changes.  Cardiovascular: negative for chest pain, dyspnea on exertion, edema, orthopnea, palpitations, paroxysmal nocturnal dyspnea or shortness of breath Dermatological: negative for rash Respiratory: negative for cough or wheezing Urologic: negative for hematuria Abdominal: negative for nausea, vomiting, diarrhea, bright red blood per rectum, melena, or hematemesis Neurologic: negative for visual changes, syncope, or dizziness All other systems reviewed and are otherwise negative except as noted above.    Blood pressure 146/98, pulse 88, height 6\' 1"  (1.854 m), weight 204 lb (92.534 kg).  General appearance: alert, cooperative and no distress Lungs: clear to auscultation bilaterally Heart: regular rate and rhythm Extremities: no edema Neurologic: Grossly normal   ASSESSMENT AND PLAN:   Essential hypertension Admitted with accelerated HTN Jan 2017  Palpitations He has noted irregular heart beats registered on his home B/P monitor.  Type 2 diabetes mellitus with renal manifestations (HCC) SCr elevated during his recent hospoitalization  Chronic renal insufficiency, stage III (moderate) SCr 1.2-1.6 during hospitalization in Jan 0000000   Diastolic dysfunction Mild, with normal LVF by echo 11/18/15  WPW (Wolff-Parkinson-White syndrome) S/P RFA 2003   PLAN  The pt tells me Dr Adron Bene had recently adjusted his medications for HTN- I believe Dyazide is new for him.  In the hospital his SCr was elevated.  The pt has no knowledge  of a history of renal insufficiency.  I suggested he discuss stopping his NSAID with Dr Adron Bene. I also suggested he increase his Norvasc to 10 mg. We could consider challenging him with an ARB but will defer that to his PCP.  We can see him again as needed.  Kerin Ransom K PA-C 11/29/2015 4:11 PM

## 2016-07-14 IMAGING — CR DG CHEST 2V
2 series · 2 of 2 positions shown · non-contrast
Comparison: 06/28/2012

CLINICAL DATA: Left-sided chest pain. Shortness of breath. Blurred
vision. Hypertension.

EXAM:
CHEST  2 VIEW

[chest pa]
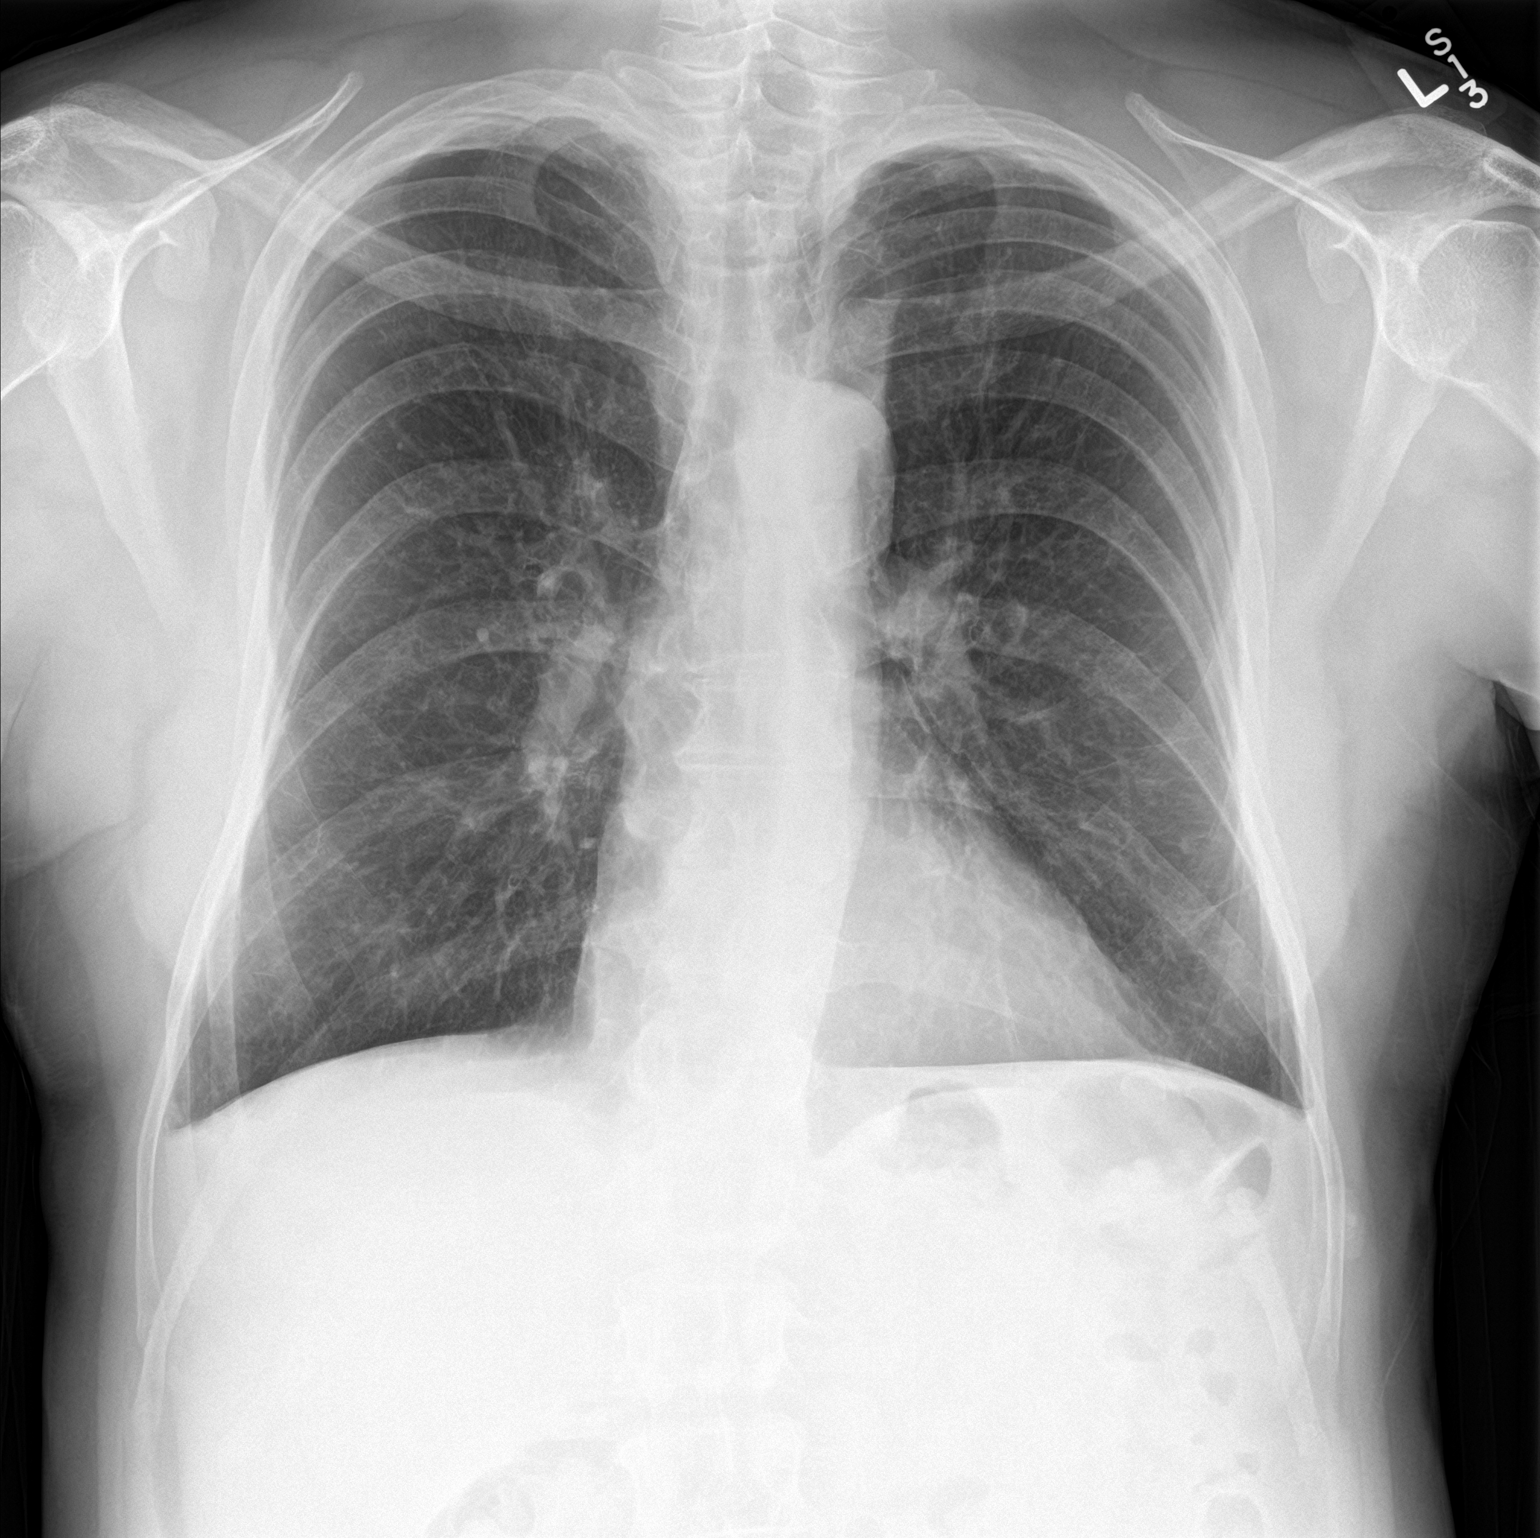

[chest lat]
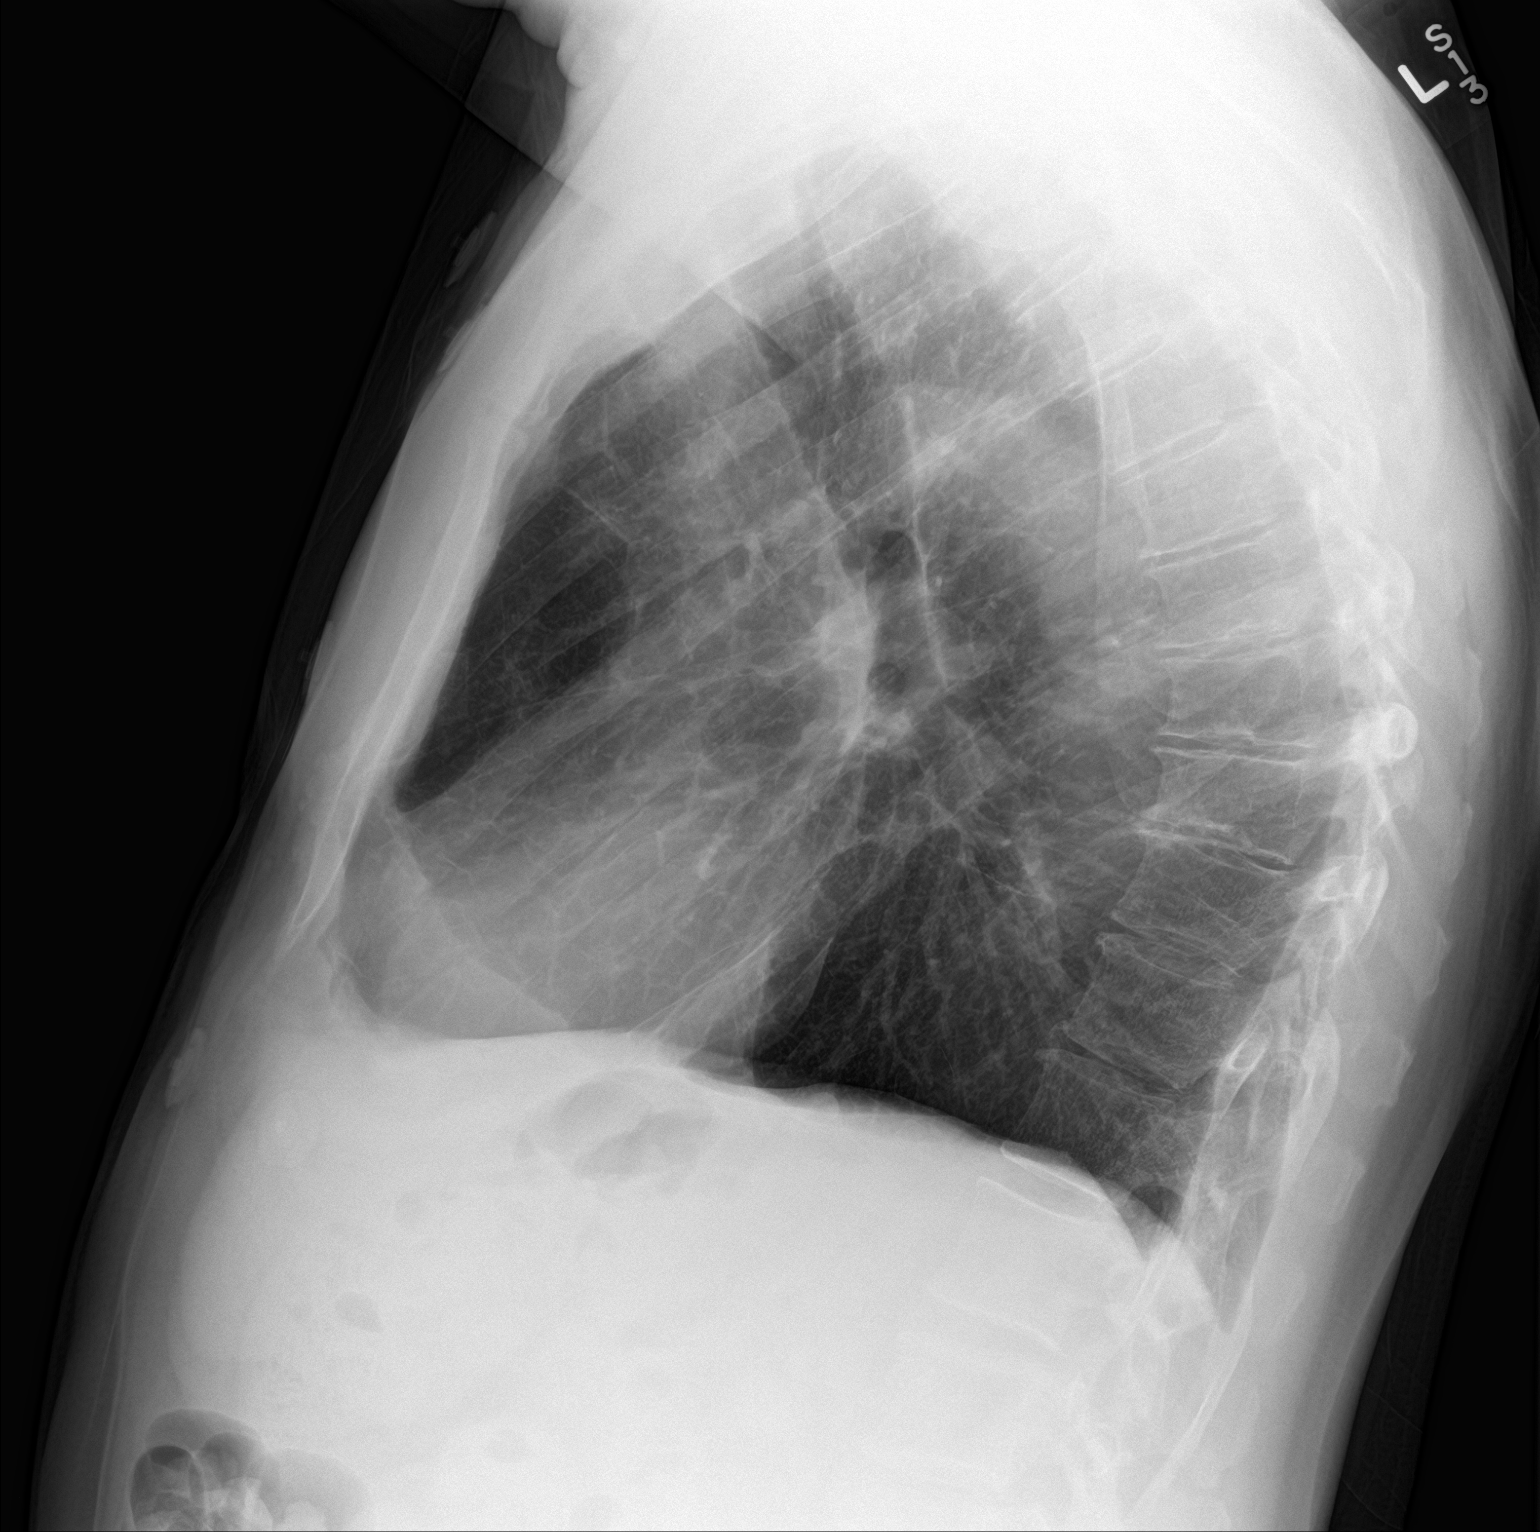

[2 of 2 positions shown; findings below may reference images not displayed]

FINDINGS: The heart size and mediastinal contours are within normal limits.
Mild scarring again seen in the left lung base. No evidence acute
pulmonary infiltrate or edema. No evidence of pleural effusion. No
evidence of pneumothorax. Mild thoracic spine degenerative changes
again noted.
IMPRESSION: No active cardiopulmonary disease.

## 2016-11-27 DIAGNOSIS — M25511 Pain in right shoulder: Secondary | ICD-10-CM | POA: Diagnosis not present

## 2016-12-01 DIAGNOSIS — M25511 Pain in right shoulder: Secondary | ICD-10-CM | POA: Diagnosis not present

## 2016-12-25 DIAGNOSIS — G8918 Other acute postprocedural pain: Secondary | ICD-10-CM | POA: Diagnosis not present

## 2016-12-25 DIAGNOSIS — M75121 Complete rotator cuff tear or rupture of right shoulder, not specified as traumatic: Secondary | ICD-10-CM | POA: Diagnosis not present

## 2016-12-25 DIAGNOSIS — M24111 Other articular cartilage disorders, right shoulder: Secondary | ICD-10-CM | POA: Diagnosis not present

## 2017-01-26 DIAGNOSIS — I10 Essential (primary) hypertension: Secondary | ICD-10-CM | POA: Diagnosis not present

## 2017-01-26 DIAGNOSIS — R7303 Prediabetes: Secondary | ICD-10-CM | POA: Diagnosis not present

## 2017-01-26 DIAGNOSIS — E7800 Pure hypercholesterolemia, unspecified: Secondary | ICD-10-CM | POA: Diagnosis not present

## 2017-01-27 DIAGNOSIS — M75121 Complete rotator cuff tear or rupture of right shoulder, not specified as traumatic: Secondary | ICD-10-CM | POA: Diagnosis not present

## 2017-01-27 DIAGNOSIS — M25611 Stiffness of right shoulder, not elsewhere classified: Secondary | ICD-10-CM | POA: Diagnosis not present

## 2017-01-27 DIAGNOSIS — M25511 Pain in right shoulder: Secondary | ICD-10-CM | POA: Diagnosis not present

## 2017-01-28 DIAGNOSIS — M25611 Stiffness of right shoulder, not elsewhere classified: Secondary | ICD-10-CM | POA: Diagnosis not present

## 2017-01-28 DIAGNOSIS — M75121 Complete rotator cuff tear or rupture of right shoulder, not specified as traumatic: Secondary | ICD-10-CM | POA: Diagnosis not present

## 2017-01-28 DIAGNOSIS — M25511 Pain in right shoulder: Secondary | ICD-10-CM | POA: Diagnosis not present

## 2017-02-03 DIAGNOSIS — M75121 Complete rotator cuff tear or rupture of right shoulder, not specified as traumatic: Secondary | ICD-10-CM | POA: Diagnosis not present

## 2017-02-03 DIAGNOSIS — M25511 Pain in right shoulder: Secondary | ICD-10-CM | POA: Diagnosis not present

## 2017-02-03 DIAGNOSIS — M25611 Stiffness of right shoulder, not elsewhere classified: Secondary | ICD-10-CM | POA: Diagnosis not present

## 2017-02-05 DIAGNOSIS — M25611 Stiffness of right shoulder, not elsewhere classified: Secondary | ICD-10-CM | POA: Diagnosis not present

## 2017-02-05 DIAGNOSIS — M75121 Complete rotator cuff tear or rupture of right shoulder, not specified as traumatic: Secondary | ICD-10-CM | POA: Diagnosis not present

## 2017-02-05 DIAGNOSIS — M25511 Pain in right shoulder: Secondary | ICD-10-CM | POA: Diagnosis not present

## 2017-02-10 DIAGNOSIS — M75121 Complete rotator cuff tear or rupture of right shoulder, not specified as traumatic: Secondary | ICD-10-CM | POA: Diagnosis not present

## 2017-02-10 DIAGNOSIS — M25511 Pain in right shoulder: Secondary | ICD-10-CM | POA: Diagnosis not present

## 2017-02-10 DIAGNOSIS — M25611 Stiffness of right shoulder, not elsewhere classified: Secondary | ICD-10-CM | POA: Diagnosis not present

## 2017-02-12 DIAGNOSIS — M25511 Pain in right shoulder: Secondary | ICD-10-CM | POA: Diagnosis not present

## 2017-02-12 DIAGNOSIS — M25611 Stiffness of right shoulder, not elsewhere classified: Secondary | ICD-10-CM | POA: Diagnosis not present

## 2017-02-12 DIAGNOSIS — M75121 Complete rotator cuff tear or rupture of right shoulder, not specified as traumatic: Secondary | ICD-10-CM | POA: Diagnosis not present

## 2017-02-17 DIAGNOSIS — M25511 Pain in right shoulder: Secondary | ICD-10-CM | POA: Diagnosis not present

## 2017-02-17 DIAGNOSIS — M25611 Stiffness of right shoulder, not elsewhere classified: Secondary | ICD-10-CM | POA: Diagnosis not present

## 2017-02-17 DIAGNOSIS — M75121 Complete rotator cuff tear or rupture of right shoulder, not specified as traumatic: Secondary | ICD-10-CM | POA: Diagnosis not present

## 2017-02-19 DIAGNOSIS — M75121 Complete rotator cuff tear or rupture of right shoulder, not specified as traumatic: Secondary | ICD-10-CM | POA: Diagnosis not present

## 2017-02-19 DIAGNOSIS — M25611 Stiffness of right shoulder, not elsewhere classified: Secondary | ICD-10-CM | POA: Diagnosis not present

## 2017-02-19 DIAGNOSIS — M25511 Pain in right shoulder: Secondary | ICD-10-CM | POA: Diagnosis not present

## 2017-02-24 DIAGNOSIS — M25611 Stiffness of right shoulder, not elsewhere classified: Secondary | ICD-10-CM | POA: Diagnosis not present

## 2017-02-24 DIAGNOSIS — M25511 Pain in right shoulder: Secondary | ICD-10-CM | POA: Diagnosis not present

## 2017-02-24 DIAGNOSIS — M75121 Complete rotator cuff tear or rupture of right shoulder, not specified as traumatic: Secondary | ICD-10-CM | POA: Diagnosis not present

## 2017-02-26 DIAGNOSIS — M75121 Complete rotator cuff tear or rupture of right shoulder, not specified as traumatic: Secondary | ICD-10-CM | POA: Diagnosis not present

## 2017-02-26 DIAGNOSIS — M25511 Pain in right shoulder: Secondary | ICD-10-CM | POA: Diagnosis not present

## 2017-02-26 DIAGNOSIS — M25611 Stiffness of right shoulder, not elsewhere classified: Secondary | ICD-10-CM | POA: Diagnosis not present

## 2017-02-27 DIAGNOSIS — R7303 Prediabetes: Secondary | ICD-10-CM | POA: Diagnosis not present

## 2017-02-27 DIAGNOSIS — I10 Essential (primary) hypertension: Secondary | ICD-10-CM | POA: Diagnosis not present

## 2017-03-03 DIAGNOSIS — M25511 Pain in right shoulder: Secondary | ICD-10-CM | POA: Diagnosis not present

## 2017-03-03 DIAGNOSIS — M25611 Stiffness of right shoulder, not elsewhere classified: Secondary | ICD-10-CM | POA: Diagnosis not present

## 2017-03-03 DIAGNOSIS — M75121 Complete rotator cuff tear or rupture of right shoulder, not specified as traumatic: Secondary | ICD-10-CM | POA: Diagnosis not present

## 2017-03-05 DIAGNOSIS — M25511 Pain in right shoulder: Secondary | ICD-10-CM | POA: Diagnosis not present

## 2017-03-05 DIAGNOSIS — M75121 Complete rotator cuff tear or rupture of right shoulder, not specified as traumatic: Secondary | ICD-10-CM | POA: Diagnosis not present

## 2017-03-05 DIAGNOSIS — M25611 Stiffness of right shoulder, not elsewhere classified: Secondary | ICD-10-CM | POA: Diagnosis not present

## 2017-03-10 DIAGNOSIS — M25511 Pain in right shoulder: Secondary | ICD-10-CM | POA: Diagnosis not present

## 2017-03-10 DIAGNOSIS — M25611 Stiffness of right shoulder, not elsewhere classified: Secondary | ICD-10-CM | POA: Diagnosis not present

## 2017-03-10 DIAGNOSIS — M75121 Complete rotator cuff tear or rupture of right shoulder, not specified as traumatic: Secondary | ICD-10-CM | POA: Diagnosis not present

## 2017-03-12 DIAGNOSIS — M25611 Stiffness of right shoulder, not elsewhere classified: Secondary | ICD-10-CM | POA: Diagnosis not present

## 2017-03-12 DIAGNOSIS — M75121 Complete rotator cuff tear or rupture of right shoulder, not specified as traumatic: Secondary | ICD-10-CM | POA: Diagnosis not present

## 2017-03-12 DIAGNOSIS — M25511 Pain in right shoulder: Secondary | ICD-10-CM | POA: Diagnosis not present

## 2017-03-17 DIAGNOSIS — M25511 Pain in right shoulder: Secondary | ICD-10-CM | POA: Diagnosis not present

## 2017-03-17 DIAGNOSIS — M75121 Complete rotator cuff tear or rupture of right shoulder, not specified as traumatic: Secondary | ICD-10-CM | POA: Diagnosis not present

## 2017-03-17 DIAGNOSIS — M25611 Stiffness of right shoulder, not elsewhere classified: Secondary | ICD-10-CM | POA: Diagnosis not present

## 2017-03-18 ENCOUNTER — Ambulatory Visit (INDEPENDENT_AMBULATORY_CARE_PROVIDER_SITE_OTHER): Payer: 59 | Admitting: Podiatry

## 2017-03-18 ENCOUNTER — Ambulatory Visit (INDEPENDENT_AMBULATORY_CARE_PROVIDER_SITE_OTHER): Payer: 59

## 2017-03-18 ENCOUNTER — Encounter: Payer: Self-pay | Admitting: Podiatry

## 2017-03-18 DIAGNOSIS — M79671 Pain in right foot: Secondary | ICD-10-CM

## 2017-03-18 DIAGNOSIS — M2142 Flat foot [pes planus] (acquired), left foot: Secondary | ICD-10-CM

## 2017-03-18 DIAGNOSIS — B351 Tinea unguium: Secondary | ICD-10-CM

## 2017-03-18 DIAGNOSIS — B359 Dermatophytosis, unspecified: Secondary | ICD-10-CM | POA: Diagnosis not present

## 2017-03-18 DIAGNOSIS — M779 Enthesopathy, unspecified: Secondary | ICD-10-CM

## 2017-03-18 DIAGNOSIS — M79672 Pain in left foot: Secondary | ICD-10-CM

## 2017-03-18 DIAGNOSIS — M2141 Flat foot [pes planus] (acquired), right foot: Secondary | ICD-10-CM | POA: Diagnosis not present

## 2017-03-18 DIAGNOSIS — B479 Mycetoma, unspecified: Secondary | ICD-10-CM | POA: Diagnosis not present

## 2017-03-18 NOTE — Progress Notes (Signed)
   Subjective:    Patient ID: Daniel Yoder, male    DOB: [birth year], 61 y.o.   MRN: [id number]  HPI  Chief Complaint  Patient presents with  . Nail Problem    Bilateral; great toes; nail discoloration & thickened nails; pt stated, "needs to have checked for nail fungus"       Review of Systems  Musculoskeletal: Positive for arthralgias.  All other systems reviewed and are negative.      Objective:   Physical Exam        Assessment & Plan:

## 2017-03-19 DIAGNOSIS — M25511 Pain in right shoulder: Secondary | ICD-10-CM | POA: Diagnosis not present

## 2017-03-19 DIAGNOSIS — M25611 Stiffness of right shoulder, not elsewhere classified: Secondary | ICD-10-CM | POA: Diagnosis not present

## 2017-03-19 DIAGNOSIS — M75121 Complete rotator cuff tear or rupture of right shoulder, not specified as traumatic: Secondary | ICD-10-CM | POA: Diagnosis not present

## 2017-03-23 NOTE — Progress Notes (Signed)
Subjective:    Patient ID: Daniel Yoder, male   DOB: 61 y.o.   MRN: [id number]   HPI patient presents with concerned about discoloration of the hallux nails bilateral and also has flatfeet which creates foot and leg pain. States the nails been this way for a fairly long period of time    Review of Systems  All other systems reviewed and are negative.       Objective:  Physical Exam  Cardiovascular: Intact distal pulses.   Musculoskeletal: Normal range of motion.  Neurological: He is alert.  Skin: Skin is warm.  Nursing note and vitals reviewed.  neurovascular status intact muscle strength adequate range of motion within normal limits with patient noted to have depression of the arch bilateral with excessive E version noted bilateral. Patient's found to have nail discoloration of the hallux nails bilateral with distal thickness brittle type debris and mild discomfort with tighter-type shoes     Assessment:   Tendinitis secondary to foot structure with mycotic nail infection bilateral      Plan:    H&P condition reviewed and recommended long-term treatment for the fungal disease with probability for fungal therapy along with probable laser therapy and topical. Today I did do sterile fungal cultures and sent this off for pathological evaluation and also went ahead today and discussed his flatfeet reviewed x-rays and discussed long-term orthotics  X-rays indicate there is significant flatfoot deformity noted bilateral

## 2017-03-24 DIAGNOSIS — M75121 Complete rotator cuff tear or rupture of right shoulder, not specified as traumatic: Secondary | ICD-10-CM | POA: Diagnosis not present

## 2017-03-24 DIAGNOSIS — M25611 Stiffness of right shoulder, not elsewhere classified: Secondary | ICD-10-CM | POA: Diagnosis not present

## 2017-03-24 DIAGNOSIS — M25511 Pain in right shoulder: Secondary | ICD-10-CM | POA: Diagnosis not present

## 2017-03-26 DIAGNOSIS — M25511 Pain in right shoulder: Secondary | ICD-10-CM | POA: Diagnosis not present

## 2017-03-26 DIAGNOSIS — M75121 Complete rotator cuff tear or rupture of right shoulder, not specified as traumatic: Secondary | ICD-10-CM | POA: Diagnosis not present

## 2017-03-26 DIAGNOSIS — M25611 Stiffness of right shoulder, not elsewhere classified: Secondary | ICD-10-CM | POA: Diagnosis not present

## 2017-03-27 DIAGNOSIS — I10 Essential (primary) hypertension: Secondary | ICD-10-CM | POA: Diagnosis not present

## 2017-04-01 DIAGNOSIS — M25611 Stiffness of right shoulder, not elsewhere classified: Secondary | ICD-10-CM | POA: Diagnosis not present

## 2017-04-01 DIAGNOSIS — M25511 Pain in right shoulder: Secondary | ICD-10-CM | POA: Diagnosis not present

## 2017-04-01 DIAGNOSIS — M75121 Complete rotator cuff tear or rupture of right shoulder, not specified as traumatic: Secondary | ICD-10-CM | POA: Diagnosis not present

## 2017-04-02 DIAGNOSIS — M75121 Complete rotator cuff tear or rupture of right shoulder, not specified as traumatic: Secondary | ICD-10-CM | POA: Diagnosis not present

## 2017-04-02 DIAGNOSIS — M25511 Pain in right shoulder: Secondary | ICD-10-CM | POA: Diagnosis not present

## 2017-04-02 DIAGNOSIS — M25611 Stiffness of right shoulder, not elsewhere classified: Secondary | ICD-10-CM | POA: Diagnosis not present

## 2017-04-07 DIAGNOSIS — M75121 Complete rotator cuff tear or rupture of right shoulder, not specified as traumatic: Secondary | ICD-10-CM | POA: Diagnosis not present

## 2017-04-07 DIAGNOSIS — M25511 Pain in right shoulder: Secondary | ICD-10-CM | POA: Diagnosis not present

## 2017-04-07 DIAGNOSIS — M25611 Stiffness of right shoulder, not elsewhere classified: Secondary | ICD-10-CM | POA: Diagnosis not present

## 2017-04-09 DIAGNOSIS — M25511 Pain in right shoulder: Secondary | ICD-10-CM | POA: Diagnosis not present

## 2017-04-09 DIAGNOSIS — M75121 Complete rotator cuff tear or rupture of right shoulder, not specified as traumatic: Secondary | ICD-10-CM | POA: Diagnosis not present

## 2017-04-09 DIAGNOSIS — M25611 Stiffness of right shoulder, not elsewhere classified: Secondary | ICD-10-CM | POA: Diagnosis not present

## 2017-04-14 DIAGNOSIS — M25511 Pain in right shoulder: Secondary | ICD-10-CM | POA: Diagnosis not present

## 2017-04-14 DIAGNOSIS — M75121 Complete rotator cuff tear or rupture of right shoulder, not specified as traumatic: Secondary | ICD-10-CM | POA: Diagnosis not present

## 2017-04-14 DIAGNOSIS — M25611 Stiffness of right shoulder, not elsewhere classified: Secondary | ICD-10-CM | POA: Diagnosis not present

## 2017-04-15 ENCOUNTER — Ambulatory Visit: Payer: 59 | Admitting: Podiatry

## 2017-04-21 DIAGNOSIS — M75121 Complete rotator cuff tear or rupture of right shoulder, not specified as traumatic: Secondary | ICD-10-CM | POA: Diagnosis not present

## 2017-04-21 DIAGNOSIS — M25511 Pain in right shoulder: Secondary | ICD-10-CM | POA: Diagnosis not present

## 2017-04-21 DIAGNOSIS — M25611 Stiffness of right shoulder, not elsewhere classified: Secondary | ICD-10-CM | POA: Diagnosis not present

## 2017-04-23 DIAGNOSIS — M25511 Pain in right shoulder: Secondary | ICD-10-CM | POA: Diagnosis not present

## 2017-04-23 DIAGNOSIS — M75121 Complete rotator cuff tear or rupture of right shoulder, not specified as traumatic: Secondary | ICD-10-CM | POA: Diagnosis not present

## 2017-04-23 DIAGNOSIS — M25611 Stiffness of right shoulder, not elsewhere classified: Secondary | ICD-10-CM | POA: Diagnosis not present

## 2017-04-27 DIAGNOSIS — M25611 Stiffness of right shoulder, not elsewhere classified: Secondary | ICD-10-CM | POA: Diagnosis not present

## 2017-04-27 DIAGNOSIS — M75121 Complete rotator cuff tear or rupture of right shoulder, not specified as traumatic: Secondary | ICD-10-CM | POA: Diagnosis not present

## 2017-04-27 DIAGNOSIS — M25511 Pain in right shoulder: Secondary | ICD-10-CM | POA: Diagnosis not present

## 2017-04-29 ENCOUNTER — Encounter: Payer: Self-pay | Admitting: Podiatry

## 2017-04-29 ENCOUNTER — Ambulatory Visit (INDEPENDENT_AMBULATORY_CARE_PROVIDER_SITE_OTHER): Payer: 59 | Admitting: Podiatry

## 2017-04-29 DIAGNOSIS — M779 Enthesopathy, unspecified: Secondary | ICD-10-CM | POA: Diagnosis not present

## 2017-04-29 DIAGNOSIS — B351 Tinea unguium: Secondary | ICD-10-CM | POA: Diagnosis not present

## 2017-04-29 MED ORDER — TERBINAFINE HCL 250 MG PO TABS: 250.0000 mg | ORAL_TABLET | Freq: Every day | ORAL | 0 refills | Status: DC

## 2017-04-29 NOTE — Progress Notes (Signed)
Subjective:    Patient ID: Daniel Yoder, male   DOB: 61 y.o.   MRN: [id number]   HPI patient presents to review the nail fungal cultures and biopsies that were done and also to discuss flatfeet    ROS      Objective:  Physical Exam neurovascular status intact negative Homans sign was noted with patient found have significant foot structural issues with depression of the arch and tendinitis-like symptoms. Also found have severe thickness of the hallux nails bilateral and slightly second and third nails bilateral     Assessment:    Tendinitis-like condition bilateral with mycotic nail infection     Plan:     H&P conditions reviewed and I've recommended long-term Berkley type orthotics to provide for complete plantar support with deep heel seat. Patient was casted for this type of device and at this time I went ahead and I discussed nail care and we will start him on oral Lamisil disease just a liver function study that was normal along with laser and other treatment. Patient will be seen back for this to be gone and we'll also get him orthotics in the next 3 weeks

## 2017-05-04 DIAGNOSIS — M75121 Complete rotator cuff tear or rupture of right shoulder, not specified as traumatic: Secondary | ICD-10-CM | POA: Diagnosis not present

## 2017-05-04 DIAGNOSIS — M25511 Pain in right shoulder: Secondary | ICD-10-CM | POA: Diagnosis not present

## 2017-05-04 DIAGNOSIS — M25611 Stiffness of right shoulder, not elsewhere classified: Secondary | ICD-10-CM | POA: Diagnosis not present

## 2017-05-07 DIAGNOSIS — M75121 Complete rotator cuff tear or rupture of right shoulder, not specified as traumatic: Secondary | ICD-10-CM | POA: Diagnosis not present

## 2017-05-07 DIAGNOSIS — M25611 Stiffness of right shoulder, not elsewhere classified: Secondary | ICD-10-CM | POA: Diagnosis not present

## 2017-05-07 DIAGNOSIS — M25511 Pain in right shoulder: Secondary | ICD-10-CM | POA: Diagnosis not present

## 2017-05-13 DIAGNOSIS — M25511 Pain in right shoulder: Secondary | ICD-10-CM | POA: Diagnosis not present

## 2017-05-13 DIAGNOSIS — M75121 Complete rotator cuff tear or rupture of right shoulder, not specified as traumatic: Secondary | ICD-10-CM | POA: Diagnosis not present

## 2017-05-13 DIAGNOSIS — M25611 Stiffness of right shoulder, not elsewhere classified: Secondary | ICD-10-CM | POA: Diagnosis not present

## 2017-05-18 ENCOUNTER — Ambulatory Visit: Payer: 59 | Admitting: Orthotics

## 2017-05-19 DIAGNOSIS — M25611 Stiffness of right shoulder, not elsewhere classified: Secondary | ICD-10-CM | POA: Diagnosis not present

## 2017-05-19 DIAGNOSIS — M25511 Pain in right shoulder: Secondary | ICD-10-CM | POA: Diagnosis not present

## 2017-05-19 DIAGNOSIS — M75121 Complete rotator cuff tear or rupture of right shoulder, not specified as traumatic: Secondary | ICD-10-CM | POA: Diagnosis not present

## 2017-05-21 DIAGNOSIS — M75121 Complete rotator cuff tear or rupture of right shoulder, not specified as traumatic: Secondary | ICD-10-CM | POA: Diagnosis not present

## 2017-05-21 DIAGNOSIS — M25611 Stiffness of right shoulder, not elsewhere classified: Secondary | ICD-10-CM | POA: Diagnosis not present

## 2017-05-21 DIAGNOSIS — M25511 Pain in right shoulder: Secondary | ICD-10-CM | POA: Diagnosis not present

## 2017-05-25 DIAGNOSIS — M75121 Complete rotator cuff tear or rupture of right shoulder, not specified as traumatic: Secondary | ICD-10-CM | POA: Diagnosis not present

## 2017-05-25 DIAGNOSIS — M25611 Stiffness of right shoulder, not elsewhere classified: Secondary | ICD-10-CM | POA: Diagnosis not present

## 2017-05-25 DIAGNOSIS — M25511 Pain in right shoulder: Secondary | ICD-10-CM | POA: Diagnosis not present

## 2017-06-01 ENCOUNTER — Encounter: Payer: 59 | Admitting: Orthotics

## 2017-06-15 ENCOUNTER — Ambulatory Visit: Payer: 59 | Admitting: Orthotics

## 2017-06-15 DIAGNOSIS — M779 Enthesopathy, unspecified: Secondary | ICD-10-CM

## 2017-06-15 DIAGNOSIS — M2141 Flat foot [pes planus] (acquired), right foot: Secondary | ICD-10-CM

## 2017-06-15 DIAGNOSIS — M2142 Flat foot [pes planus] (acquired), left foot: Secondary | ICD-10-CM

## 2017-06-15 NOTE — Progress Notes (Signed)
Patient came in today to pick up custom made foot orthotics.  The goals were accomplished and the patient reported no dissatisfaction with said orthotics.  Patient was advised of breakin period and how to report any issues. 

## 2017-06-21 DIAGNOSIS — M545 Low back pain: Secondary | ICD-10-CM | POA: Diagnosis not present

## 2017-08-23 DIAGNOSIS — Z23 Encounter for immunization: Secondary | ICD-10-CM | POA: Diagnosis not present

## 2017-08-28 ENCOUNTER — Telehealth: Payer: Self-pay | Admitting: *Deleted

## 2017-08-28 NOTE — Telephone Encounter (Signed)
Refill request terbinafine. Pt has received 90 therapeutic doses. Refill returned denying.

## 2017-09-27 DIAGNOSIS — H524 Presbyopia: Secondary | ICD-10-CM | POA: Diagnosis not present

## 2017-09-28 DIAGNOSIS — Z125 Encounter for screening for malignant neoplasm of prostate: Secondary | ICD-10-CM | POA: Diagnosis not present

## 2017-09-28 DIAGNOSIS — I10 Essential (primary) hypertension: Secondary | ICD-10-CM | POA: Diagnosis not present

## 2017-09-28 DIAGNOSIS — E7800 Pure hypercholesterolemia, unspecified: Secondary | ICD-10-CM | POA: Diagnosis not present

## 2017-09-28 DIAGNOSIS — R7303 Prediabetes: Secondary | ICD-10-CM | POA: Diagnosis not present

## 2017-10-04 DIAGNOSIS — J019 Acute sinusitis, unspecified: Secondary | ICD-10-CM | POA: Diagnosis not present

## 2017-10-10 DIAGNOSIS — R0602 Shortness of breath: Secondary | ICD-10-CM | POA: Diagnosis not present

## 2017-10-10 DIAGNOSIS — J018 Other acute sinusitis: Secondary | ICD-10-CM | POA: Diagnosis not present

## 2017-10-25 DIAGNOSIS — J018 Other acute sinusitis: Secondary | ICD-10-CM | POA: Diagnosis not present

## 2017-10-25 DIAGNOSIS — R0602 Shortness of breath: Secondary | ICD-10-CM | POA: Diagnosis not present

## 2018-01-25 DIAGNOSIS — Z1211 Encounter for screening for malignant neoplasm of colon: Secondary | ICD-10-CM | POA: Diagnosis not present

## 2018-01-25 DIAGNOSIS — R197 Diarrhea, unspecified: Secondary | ICD-10-CM | POA: Diagnosis not present

## 2018-01-25 DIAGNOSIS — R109 Unspecified abdominal pain: Secondary | ICD-10-CM | POA: Diagnosis not present

## 2018-02-12 DIAGNOSIS — Z1211 Encounter for screening for malignant neoplasm of colon: Secondary | ICD-10-CM | POA: Diagnosis not present

## 2018-02-12 DIAGNOSIS — K635 Polyp of colon: Secondary | ICD-10-CM | POA: Diagnosis not present

## 2018-02-16 DIAGNOSIS — Z1211 Encounter for screening for malignant neoplasm of colon: Secondary | ICD-10-CM | POA: Diagnosis not present

## 2018-02-16 DIAGNOSIS — K635 Polyp of colon: Secondary | ICD-10-CM | POA: Diagnosis not present

## 2018-03-03 DIAGNOSIS — J069 Acute upper respiratory infection, unspecified: Secondary | ICD-10-CM | POA: Diagnosis not present

## 2018-04-13 DIAGNOSIS — E7800 Pure hypercholesterolemia, unspecified: Secondary | ICD-10-CM | POA: Diagnosis not present

## 2018-04-13 DIAGNOSIS — Z23 Encounter for immunization: Secondary | ICD-10-CM | POA: Diagnosis not present

## 2018-04-13 DIAGNOSIS — R7303 Prediabetes: Secondary | ICD-10-CM | POA: Diagnosis not present

## 2018-04-13 DIAGNOSIS — I10 Essential (primary) hypertension: Secondary | ICD-10-CM | POA: Diagnosis not present

## 2018-05-11 DIAGNOSIS — J209 Acute bronchitis, unspecified: Secondary | ICD-10-CM | POA: Diagnosis not present

## 2018-07-15 DIAGNOSIS — Z041 Encounter for examination and observation following transport accident: Secondary | ICD-10-CM | POA: Diagnosis not present

## 2018-07-20 DIAGNOSIS — M545 Low back pain: Secondary | ICD-10-CM | POA: Diagnosis not present

## 2018-07-20 DIAGNOSIS — Z23 Encounter for immunization: Secondary | ICD-10-CM | POA: Diagnosis not present

## 2018-07-21 DIAGNOSIS — E782 Mixed hyperlipidemia: Secondary | ICD-10-CM | POA: Diagnosis not present

## 2018-07-21 DIAGNOSIS — Z79899 Other long term (current) drug therapy: Secondary | ICD-10-CM | POA: Diagnosis not present

## 2018-07-26 DIAGNOSIS — M545 Low back pain: Secondary | ICD-10-CM | POA: Diagnosis not present

## 2018-07-26 DIAGNOSIS — M799 Soft tissue disorder, unspecified: Secondary | ICD-10-CM | POA: Diagnosis not present

## 2018-07-26 DIAGNOSIS — M2560 Stiffness of unspecified joint, not elsewhere classified: Secondary | ICD-10-CM | POA: Diagnosis not present

## 2018-07-28 DIAGNOSIS — M545 Low back pain: Secondary | ICD-10-CM | POA: Diagnosis not present

## 2018-07-28 DIAGNOSIS — M799 Soft tissue disorder, unspecified: Secondary | ICD-10-CM | POA: Diagnosis not present

## 2018-07-28 DIAGNOSIS — M2560 Stiffness of unspecified joint, not elsewhere classified: Secondary | ICD-10-CM | POA: Diagnosis not present

## 2018-07-31 DIAGNOSIS — M799 Soft tissue disorder, unspecified: Secondary | ICD-10-CM | POA: Diagnosis not present

## 2018-07-31 DIAGNOSIS — M545 Low back pain: Secondary | ICD-10-CM | POA: Diagnosis not present

## 2018-07-31 DIAGNOSIS — M2560 Stiffness of unspecified joint, not elsewhere classified: Secondary | ICD-10-CM | POA: Diagnosis not present

## 2018-08-04 DIAGNOSIS — M5441 Lumbago with sciatica, right side: Secondary | ICD-10-CM | POA: Diagnosis not present

## 2018-08-10 DIAGNOSIS — S335XXD Sprain of ligaments of lumbar spine, subsequent encounter: Secondary | ICD-10-CM | POA: Diagnosis not present

## 2018-08-10 DIAGNOSIS — S39012D Strain of muscle, fascia and tendon of lower back, subsequent encounter: Secondary | ICD-10-CM | POA: Diagnosis not present

## 2018-08-13 DIAGNOSIS — S335XXD Sprain of ligaments of lumbar spine, subsequent encounter: Secondary | ICD-10-CM | POA: Diagnosis not present

## 2018-08-13 DIAGNOSIS — S39012D Strain of muscle, fascia and tendon of lower back, subsequent encounter: Secondary | ICD-10-CM | POA: Diagnosis not present

## 2018-08-16 DIAGNOSIS — S335XXD Sprain of ligaments of lumbar spine, subsequent encounter: Secondary | ICD-10-CM | POA: Diagnosis not present

## 2018-08-16 DIAGNOSIS — S39012D Strain of muscle, fascia and tendon of lower back, subsequent encounter: Secondary | ICD-10-CM | POA: Diagnosis not present

## 2018-08-18 DIAGNOSIS — S39012D Strain of muscle, fascia and tendon of lower back, subsequent encounter: Secondary | ICD-10-CM | POA: Diagnosis not present

## 2018-08-18 DIAGNOSIS — S335XXD Sprain of ligaments of lumbar spine, subsequent encounter: Secondary | ICD-10-CM | POA: Diagnosis not present

## 2018-08-23 DIAGNOSIS — S39012D Strain of muscle, fascia and tendon of lower back, subsequent encounter: Secondary | ICD-10-CM | POA: Diagnosis not present

## 2018-08-23 DIAGNOSIS — S335XXD Sprain of ligaments of lumbar spine, subsequent encounter: Secondary | ICD-10-CM | POA: Diagnosis not present

## 2018-08-25 DIAGNOSIS — S39012D Strain of muscle, fascia and tendon of lower back, subsequent encounter: Secondary | ICD-10-CM | POA: Diagnosis not present

## 2018-08-25 DIAGNOSIS — S335XXD Sprain of ligaments of lumbar spine, subsequent encounter: Secondary | ICD-10-CM | POA: Diagnosis not present

## 2018-08-30 DIAGNOSIS — S335XXD Sprain of ligaments of lumbar spine, subsequent encounter: Secondary | ICD-10-CM | POA: Diagnosis not present

## 2018-08-30 DIAGNOSIS — S39012D Strain of muscle, fascia and tendon of lower back, subsequent encounter: Secondary | ICD-10-CM | POA: Diagnosis not present

## 2018-09-01 DIAGNOSIS — S39012D Strain of muscle, fascia and tendon of lower back, subsequent encounter: Secondary | ICD-10-CM | POA: Diagnosis not present

## 2018-09-01 DIAGNOSIS — S335XXD Sprain of ligaments of lumbar spine, subsequent encounter: Secondary | ICD-10-CM | POA: Diagnosis not present

## 2018-09-06 DIAGNOSIS — S335XXD Sprain of ligaments of lumbar spine, subsequent encounter: Secondary | ICD-10-CM | POA: Diagnosis not present

## 2018-09-06 DIAGNOSIS — M546 Pain in thoracic spine: Secondary | ICD-10-CM | POA: Diagnosis not present

## 2018-09-06 DIAGNOSIS — S39012D Strain of muscle, fascia and tendon of lower back, subsequent encounter: Secondary | ICD-10-CM | POA: Diagnosis not present

## 2018-09-08 DIAGNOSIS — S335XXD Sprain of ligaments of lumbar spine, subsequent encounter: Secondary | ICD-10-CM | POA: Diagnosis not present

## 2018-09-08 DIAGNOSIS — S39012D Strain of muscle, fascia and tendon of lower back, subsequent encounter: Secondary | ICD-10-CM | POA: Diagnosis not present

## 2018-09-11 DIAGNOSIS — M546 Pain in thoracic spine: Secondary | ICD-10-CM | POA: Diagnosis not present

## 2018-09-13 DIAGNOSIS — S39012D Strain of muscle, fascia and tendon of lower back, subsequent encounter: Secondary | ICD-10-CM | POA: Diagnosis not present

## 2018-09-13 DIAGNOSIS — S335XXD Sprain of ligaments of lumbar spine, subsequent encounter: Secondary | ICD-10-CM | POA: Diagnosis not present

## 2018-09-15 DIAGNOSIS — S335XXD Sprain of ligaments of lumbar spine, subsequent encounter: Secondary | ICD-10-CM | POA: Diagnosis not present

## 2018-09-15 DIAGNOSIS — S39012D Strain of muscle, fascia and tendon of lower back, subsequent encounter: Secondary | ICD-10-CM | POA: Diagnosis not present

## 2018-09-15 DIAGNOSIS — M546 Pain in thoracic spine: Secondary | ICD-10-CM | POA: Diagnosis not present

## 2018-09-20 DIAGNOSIS — S335XXD Sprain of ligaments of lumbar spine, subsequent encounter: Secondary | ICD-10-CM | POA: Diagnosis not present

## 2018-09-20 DIAGNOSIS — S39012D Strain of muscle, fascia and tendon of lower back, subsequent encounter: Secondary | ICD-10-CM | POA: Diagnosis not present

## 2018-09-22 DIAGNOSIS — S335XXD Sprain of ligaments of lumbar spine, subsequent encounter: Secondary | ICD-10-CM | POA: Diagnosis not present

## 2018-09-22 DIAGNOSIS — S39012D Strain of muscle, fascia and tendon of lower back, subsequent encounter: Secondary | ICD-10-CM | POA: Diagnosis not present

## 2018-09-28 DIAGNOSIS — S39012D Strain of muscle, fascia and tendon of lower back, subsequent encounter: Secondary | ICD-10-CM | POA: Diagnosis not present

## 2018-09-28 DIAGNOSIS — S335XXD Sprain of ligaments of lumbar spine, subsequent encounter: Secondary | ICD-10-CM | POA: Diagnosis not present

## 2018-10-07 DIAGNOSIS — S39012D Strain of muscle, fascia and tendon of lower back, subsequent encounter: Secondary | ICD-10-CM | POA: Diagnosis not present

## 2018-10-07 DIAGNOSIS — S335XXD Sprain of ligaments of lumbar spine, subsequent encounter: Secondary | ICD-10-CM | POA: Diagnosis not present

## 2018-10-11 DIAGNOSIS — S335XXD Sprain of ligaments of lumbar spine, subsequent encounter: Secondary | ICD-10-CM | POA: Diagnosis not present

## 2018-10-11 DIAGNOSIS — S39012D Strain of muscle, fascia and tendon of lower back, subsequent encounter: Secondary | ICD-10-CM | POA: Diagnosis not present

## 2018-10-13 DIAGNOSIS — M546 Pain in thoracic spine: Secondary | ICD-10-CM | POA: Diagnosis not present

## 2018-10-19 DIAGNOSIS — S39012D Strain of muscle, fascia and tendon of lower back, subsequent encounter: Secondary | ICD-10-CM | POA: Diagnosis not present

## 2018-10-19 DIAGNOSIS — S335XXD Sprain of ligaments of lumbar spine, subsequent encounter: Secondary | ICD-10-CM | POA: Diagnosis not present

## 2018-10-22 DIAGNOSIS — S335XXD Sprain of ligaments of lumbar spine, subsequent encounter: Secondary | ICD-10-CM | POA: Diagnosis not present

## 2018-10-22 DIAGNOSIS — S39012D Strain of muscle, fascia and tendon of lower back, subsequent encounter: Secondary | ICD-10-CM | POA: Diagnosis not present

## 2018-10-25 DIAGNOSIS — S335XXD Sprain of ligaments of lumbar spine, subsequent encounter: Secondary | ICD-10-CM | POA: Diagnosis not present

## 2018-10-25 DIAGNOSIS — S39012D Strain of muscle, fascia and tendon of lower back, subsequent encounter: Secondary | ICD-10-CM | POA: Diagnosis not present

## 2018-10-25 DIAGNOSIS — M546 Pain in thoracic spine: Secondary | ICD-10-CM | POA: Diagnosis not present

## 2018-10-28 DIAGNOSIS — S39012D Strain of muscle, fascia and tendon of lower back, subsequent encounter: Secondary | ICD-10-CM | POA: Diagnosis not present

## 2018-10-28 DIAGNOSIS — S335XXD Sprain of ligaments of lumbar spine, subsequent encounter: Secondary | ICD-10-CM | POA: Diagnosis not present

## 2019-08-08 ENCOUNTER — Telehealth: Payer: Self-pay | Admitting: Podiatry

## 2019-08-08 ENCOUNTER — Ambulatory Visit (INDEPENDENT_AMBULATORY_CARE_PROVIDER_SITE_OTHER): Payer: 59 | Admitting: Podiatry

## 2019-08-08 ENCOUNTER — Encounter: Payer: Self-pay | Admitting: Podiatry

## 2019-08-08 ENCOUNTER — Other Ambulatory Visit: Payer: Self-pay

## 2019-08-08 DIAGNOSIS — M2142 Flat foot [pes planus] (acquired), left foot: Secondary | ICD-10-CM | POA: Diagnosis not present

## 2019-08-08 DIAGNOSIS — M79675 Pain in left toe(s): Secondary | ICD-10-CM

## 2019-08-08 DIAGNOSIS — B351 Tinea unguium: Secondary | ICD-10-CM

## 2019-08-08 DIAGNOSIS — M79674 Pain in right toe(s): Secondary | ICD-10-CM

## 2019-08-08 DIAGNOSIS — M2141 Flat foot [pes planus] (acquired), right foot: Secondary | ICD-10-CM

## 2019-08-08 DIAGNOSIS — M7751 Other enthesopathy of right foot: Secondary | ICD-10-CM

## 2019-08-08 DIAGNOSIS — M779 Enthesopathy, unspecified: Secondary | ICD-10-CM

## 2019-08-08 DIAGNOSIS — M7752 Other enthesopathy of left foot: Secondary | ICD-10-CM | POA: Diagnosis not present

## 2019-08-08 NOTE — Progress Notes (Signed)
Subjective:   Patient ID: Daniel Yoder, male   DOB: 63 y.o.   MRN: [id number]   HPI Patient states that he is got a spot on the bottom of his left big toe that he was concerned about he has been shaving it down and also he has flatfoot deformity and knows he probably needs new devices to wear for walking   ROS      Objective:  Physical Exam  Neurovascular status intact with patient having what appears to be keratotic lesion sub-first hallux left but no longer symptomatic and does have significant flatfoot deformity     Assessment:  Tendinitis with keratotic lesion formation     Plan:  Reviewed condition do not recommend debridement and went ahead and want to start orthotic treatment for this patient and patient is scanned for customized orthotics as I do think they are very beneficial for him long-term

## 2019-08-08 NOTE — Telephone Encounter (Signed)
Pt is requesting a new pair of orthotics made like his previous ones. Please give patient a call.

## 2020-07-16 ENCOUNTER — Other Ambulatory Visit: Payer: Self-pay

## 2020-07-16 ENCOUNTER — Emergency Department (HOSPITAL_BASED_OUTPATIENT_CLINIC_OR_DEPARTMENT_OTHER)
Admission: EM | Admit: 2020-07-16 | Discharge: 2020-07-16 | Disposition: A | Discharge status: Home / Self Care | Payer: 59 | Attending: Emergency Medicine | Admitting: Emergency Medicine

## 2020-07-16 ENCOUNTER — Encounter (HOSPITAL_BASED_OUTPATIENT_CLINIC_OR_DEPARTMENT_OTHER): Payer: Self-pay

## 2020-07-16 DIAGNOSIS — B370 Candidal stomatitis: Secondary | ICD-10-CM

## 2020-07-16 DIAGNOSIS — E1122 Type 2 diabetes mellitus with diabetic chronic kidney disease: Secondary | ICD-10-CM | POA: Diagnosis not present

## 2020-07-16 DIAGNOSIS — N183 Chronic kidney disease, stage 3 unspecified: Secondary | ICD-10-CM | POA: Insufficient documentation

## 2020-07-16 DIAGNOSIS — E1165 Type 2 diabetes mellitus with hyperglycemia: Secondary | ICD-10-CM | POA: Diagnosis not present

## 2020-07-16 DIAGNOSIS — Z7984 Long term (current) use of oral hypoglycemic drugs: Secondary | ICD-10-CM | POA: Insufficient documentation

## 2020-07-16 DIAGNOSIS — I129 Hypertensive chronic kidney disease with stage 1 through stage 4 chronic kidney disease, or unspecified chronic kidney disease: Secondary | ICD-10-CM | POA: Insufficient documentation

## 2020-07-16 DIAGNOSIS — Z79899 Other long term (current) drug therapy: Secondary | ICD-10-CM | POA: Diagnosis not present

## 2020-07-16 DIAGNOSIS — Z87891 Personal history of nicotine dependence: Secondary | ICD-10-CM | POA: Insufficient documentation

## 2020-07-16 DIAGNOSIS — R739 Hyperglycemia, unspecified: Secondary | ICD-10-CM

## 2020-07-16 LAB — CBG MONITORING, ED
Glucose-Capillary: >600 mg/dL (ref 70–99)
Glucose-Capillary: 441 mg/dL — ABNORMAL HIGH (ref 70–99)
Glucose-Capillary: 308 mg/dL — ABNORMAL HIGH (ref 70–99)

## 2020-07-16 LAB — CBC
HCT: 38.9 % — ABNORMAL LOW (ref 39.0–52.0)
Hemoglobin: 13.6 g/dL (ref 13.0–17.0)
MCH: 26.2 pg (ref 26.0–34.0)
MCHC: 35 g/dL (ref 30.0–36.0)
MCV: 74.8 fL — ABNORMAL LOW (ref 80.0–100.0)
Platelets: 329 10*3/uL (ref 150–400)
RBC: 5.2 MIL/uL (ref 4.22–5.81)
RDW: 13.6 % (ref 11.5–15.5)
WBC: 9.4 10*3/uL (ref 4.0–10.5)
nRBC: 0 % (ref 0.0–0.2)

## 2020-07-16 LAB — BASIC METABOLIC PANEL
Anion gap: 14 (ref 5–15)
BUN: 22 mg/dL (ref 8–23)
CO2: 24 mmol/L (ref 22–32)
Calcium: 9.4 mg/dL (ref 8.9–10.3)
Chloride: 88 mmol/L — ABNORMAL LOW (ref 98–111)
Creatinine, Ser: 1.46 mg/dL — ABNORMAL HIGH (ref 0.61–1.24)
GFR calc Af Amer: 59 mL/min — ABNORMAL LOW (ref >60)
GFR calc non Af Amer: 51 mL/min — ABNORMAL LOW (ref >60)
Glucose, Bld: 601 mg/dL (ref 70–99)
Potassium: 5 mmol/L (ref 3.5–5.1)
Sodium: 126 mmol/L — ABNORMAL LOW (ref 135–145)

## 2020-07-16 LAB — URINALYSIS, ROUTINE W REFLEX MICROSCOPIC
Bilirubin Urine: NEGATIVE
Glucose, UA: >=500 mg/dL — AB
Hgb urine dipstick: NEGATIVE
Ketones, ur: 15 mg/dL — AB
Leukocytes,Ua: NEGATIVE
Nitrite: NEGATIVE
Protein, ur: NEGATIVE mg/dL
Specific Gravity, Urine: 1.01 (ref 1.005–1.030)
pH: 5 (ref 5.0–8.0)

## 2020-07-16 LAB — HEMOGLOBIN A1C
Hgb A1c MFr Bld: 13.7 % — ABNORMAL HIGH (ref 4.8–5.6)
Mean Plasma Glucose: 346.49 mg/dL

## 2020-07-16 LAB — URINALYSIS, MICROSCOPIC (REFLEX)

## 2020-07-16 MED ORDER — LIVING WELL WITH DIABETES BOOK: Freq: Once | Status: AC

## 2020-07-16 MED ORDER — SODIUM CHLORIDE 0.9 % IV BOLUS
1000.0000 mL | Freq: Once | INTRAVENOUS | Status: AC
  Administered 2020-07-16: 1000 mL via INTRAVENOUS

## 2020-07-16 MED ORDER — LIVING WELL WITH DIABETES BOOK
Status: AC
  Filled 2020-07-16: qty 1

## 2020-07-16 MED ORDER — METFORMIN HCL 500 MG PO TABS: 500.0000 mg | ORAL_TABLET | Freq: Two times a day (BID) | ORAL | 0 refills | Status: DC

## 2020-07-16 MED ORDER — INSULIN ASPART PROT & ASPART (70-30 MIX) 100 UNIT/ML ~~LOC~~ SUSP
10.0000 [IU] | Freq: Once | SUBCUTANEOUS | Status: AC
  Administered 2020-07-16: 10 [IU] via SUBCUTANEOUS
  Filled 2020-07-16: qty 10

## 2020-07-16 NOTE — Discharge Instructions (Signed)
Please read and follow all provided instructions.  Your diagnoses today include:  1. Hyperglycemia   2. Oral thrush     Tests performed today include:  Blood counts and electrolytes - shows high blood sugar  Urine test - no infection, sugar in urine  Vital signs. See below for your results today.   Medications prescribed:   Metformin - medication for diabetes  If you develop nausea, vomiting, or diarrhea that you cannot tolerate, please take 1 pill daily for 5 days and then increase back to 2 pills a day.  Try not to entirely stop this medication as the side effects typically get better with time.  Take any prescribed medications only as directed.  Home care instructions:  Follow any educational materials contained in this packet.  BE VERY CAREFUL not to take multiple medicines containing Tylenol (also called acetaminophen). Doing so can lead to an overdose which can damage your liver and cause liver failure and possibly death.   Follow-up instructions: Please follow-up with your primary care provider in the next 3 days for further evaluation of your symptoms.   Return instructions:   Please return to the Emergency Department if you experience worsening symptoms.   Please return if you have any other emergent concerns.  Additional Information:  Your vital signs today were: BP (!) 135/98   Pulse 70   Temp 98.8 F (37.1 C) (Oral)   Resp 16   Ht 6\' 1"  (1.854 m)   Wt 87.5 kg   SpO2 96%   BMI 25.46 kg/m  If your blood pressure (BP) was elevated above 135/85 this visit, please have this repeated by your doctor within one month. --------------

## 2020-07-16 NOTE — ED Notes (Signed)
Pt states he has elevated blood sugars this am in the 400's fasting.  Pt states he has been having excessive thirst and urination recently.  Skin warm and dry.  IV established, blood  Drawn and NS bolus initiated.

## 2020-07-16 NOTE — ED Provider Notes (Signed)
Care assumed from [facility], [place]. See his note for full H&P.  Per his note, "Patient with history of "prediabetes", chronic kidney disease --presents to the emergency department for elevated blood sugars.  Patient states that over the past 2 to 3 weeks he has been having excessive thirst and urination.  He was seen by his doctor and was prescribed several courses of medication for UTI and prostatitis.  Over the past couple of days he developed thrush and checked his blood sugar on his wife's glucometer.  This was elevated into the 400s.  Patient went to walk-in clinic today and was referred to the emergency department for elevated blood sugar.  Patient denies recent symptoms of illness including fevers, nausea, vomiting, diarrhea.  He has not had dysuria, cough, shortness of breath.  Patient states that he has been on Metformin in the past and tolerated this reasonably well.  No other complaints."   Physical Exam  BP (!) 129/93 (BP Location: Left Arm)   Pulse 74   Temp 98.4 F (36.9 C) (Oral)   Resp 18   Ht 6\' 1"  (1.854 m)   Wt 87.5 kg   SpO2 95%   BMI 25.46 kg/m   Physical Exam Constitutional:      General: He is not in acute distress.    Appearance: He is well-developed.  Eyes:     Conjunctiva/sclera: Conjunctivae normal.  Cardiovascular:     Rate and Rhythm: Normal rate.  Pulmonary:     Effort: Pulmonary effort is normal.  Skin:    General: Skin is warm and dry.  Neurological:     Mental Status: He is alert and oriented to person, place, and time.       ED Course/Procedures     Procedures  Results for orders placed or performed during the hospital encounter of 58/09/98  Basic metabolic panel  Result Value Ref Range   Sodium 126 (L) 135 - 145 mmol/L   Potassium 5.0 3.5 - 5.1 mmol/L   Chloride 88 (L) 98 - 111 mmol/L   CO2 24 22 - 32 mmol/L   Glucose, Bld 601 (HH) 70 - 99 mg/dL   BUN 22 8 - 23 mg/dL   Creatinine, Ser 1.46 (H) 0.61 - 1.24 mg/dL   Calcium 9.4 8.9 -  10.3 mg/dL   GFR calc non Af Amer 51 (L) >60 mL/min   GFR calc Af Amer 59 (L) >60 mL/min   Anion gap 14 5 - 15  CBC  Result Value Ref Range   WBC 9.4 4.0 - 10.5 K/uL   RBC 5.20 4.22 - 5.81 MIL/uL   Hemoglobin 13.6 13.0 - 17.0 g/dL   HCT 38.9 (L) 39 - 52 %   MCV 74.8 (L) 80.0 - 100.0 fL   MCH 26.2 26.0 - 34.0 pg   MCHC 35.0 30.0 - 36.0 g/dL   RDW 13.6 11.5 - 15.5 %   Platelets 329 150 - 400 K/uL   nRBC 0.0 0.0 - 0.2 %  Urinalysis, Routine w reflex microscopic Urine, Clean Catch  Result Value Ref Range   Color, Urine YELLOW YELLOW   APPearance CLEAR CLEAR   Specific Gravity, Urine 1.010 1.005 - 1.030   pH 5.0 5.0 - 8.0   Glucose, UA >=500 (A) NEGATIVE mg/dL   Hgb urine dipstick NEGATIVE NEGATIVE   Bilirubin Urine NEGATIVE NEGATIVE   Ketones, ur 15 (A) NEGATIVE mg/dL   Protein, ur NEGATIVE NEGATIVE mg/dL   Nitrite NEGATIVE NEGATIVE   Leukocytes,Ua NEGATIVE  NEGATIVE  Urinalysis, Microscopic (reflex)  Result Value Ref Range   RBC / HPF 0-5 0 - 5 RBC/hpf   WBC, UA 0-5 0 - 5 WBC/hpf   Bacteria, UA FEW (A) NONE SEEN   Squamous Epithelial / LPF 0-5 0 - 5   Budding Yeast PRESENT   CBG monitoring, ED  Result Value Ref Range   Glucose-Capillary >600 (HH) 70 - 99 mg/dL  CBG monitoring, ED  Result Value Ref Range   Glucose-Capillary 441 (H) 70 - 99 mg/dL  POC CBG, ED  Result Value Ref Range   Glucose-Capillary 308 (H) 70 - 99 mg/dL   No results found.   MDM   Briefly, 64 year old male presenting for evaluation of polyuria, polydipsia found to have new onset diabetes.  His laboratory work was reviewed and did not show any evidence of DKA.  He was given subcu insulin as well as fluid resuscitation in the emergency department.  Repeat CBG is at 300.  He is feeling improved after IV fluids on reassessment and his vital signs are reassuring.  We will plan to discharge him home on Metformin to follow-up with his PCP.  Have advised him to return to the emergency department for any new  or worsening symptoms in the meantime.  He voices understanding of the plan and reasons to return.  All questions answered.  Patient stable for discharge.    Bishop Dublin 07/16/20 2033    Maudie Flakes, MD 07/16/20 [id number]

## 2020-07-16 NOTE — ED Notes (Signed)
PT CBG 308

## 2020-07-16 NOTE — ED Triage Notes (Signed)
Pt with elevated BS at home when using wife's glucometer-states he has dx prediabetes-sent from [place] PCP with noted reading elevated A1c-NAD-steady gait

## 2020-07-16 NOTE — ED Provider Notes (Signed)
[place] EMERGENCY DEPARTMENT Provider Note   CSN: [id number] Arrival date & time: 07/16/20  1353     History Chief Complaint  Patient presents with  . Hyperglycemia    Daniel Yoder is a 64 y.o. male.  Patient with history of "prediabetes", chronic kidney disease --presents to the emergency department for elevated blood sugars.  Patient states that over the past 2 to 3 weeks he has been having excessive thirst and urination.  He was seen by his doctor and was prescribed several courses of medication for UTI and prostatitis.  Over the past couple of days he developed thrush and checked his blood sugar on his wife's glucometer.  This was elevated into the 400s.  Patient went to walk-in clinic today and was referred to the emergency department for elevated blood sugar.  Patient denies recent symptoms of illness including fevers, nausea, vomiting, diarrhea.  He has not had dysuria, cough, shortness of breath.  Patient states that he has been on Metformin in the past and tolerated this reasonably well.  No other complaints.        Past Medical History:  Diagnosis Date  . Hypertension   . WPW (Wolff-Parkinson-White syndrome) 2003   Ablation 2003    Patient Active Problem List   Diagnosis Date Noted  . Chronic renal insufficiency, stage III (moderate) 11/29/2015  . WPW (Wolff-Parkinson-White syndrome) 11/29/2015  . Dyslipidemia 11/29/2015  . Diastolic dysfunction 53/61/4431  . Palpitations 11/29/2015  . DM type 2, goal HbA1c < 7% (HCC)   . Chest pain 11/17/2015  . Type 2 diabetes mellitus with renal manifestations ([place]) 11/17/2015  . Dehydration 11/17/2015  . Dyspnea 11/16/2015  . COMMON MIGRAINE 12/27/2006  . Essential hypertension 12/27/2006    History reviewed. No pertinent surgical history.     Family History  Problem Relation Age of Onset  . Hypertension Brother   . Diabetes Mellitus II Brother   . Heart disease Brother        Prostate Cancer  .  Diabetes Mellitus II Brother   . Hypertension Brother   . Hypertension Mother   . Heart disease Mother        CHF  . Stroke Father   . Hypertension Father   . Diabetes Mellitus II Father     Social History   Tobacco Use  . Smoking status: Former Smoker    Packs/day: 0.00    Years: 30.00    Pack years: 0.00    Types: Cigarettes  . Smokeless tobacco: Never Used  Vaping Use  . Vaping Use: Never used  Substance Use Topics  . Alcohol use: No    Alcohol/week: 0.0 standard drinks  . Drug use: No    Home Medications Prior to Admission medications   Medication Sig Start Date End Date Taking? Authorizing Provider  amLODipine (NORVASC) 10 MG tablet Take 1 tablet (10 mg total) by mouth at bedtime. 11/29/15   Erlene Quan, PA-C  atorvastatin (LIPITOR) 10 MG tablet  08/06/19   [provider]  carvedilol (COREG) 25 MG tablet Take 1 tablet (25 mg total) by mouth 2 (two) times daily with a meal. 11/17/15   Eugenie Filler, MD  diclofenac (VOLTAREN) 75 MG EC tablet Take 75 mg by mouth daily.    [provider]  meloxicam (MOBIC) 7.5 MG tablet TAKE 1 TABLET BY MOUTH ONCE A DAY AS NEEDED FOR PAIN WITH FOOD 03/16/19   [provider]  metFORMIN (GLUCOPHAGE) 500 MG tablet Take  500 mg by mouth 2 (two) times daily with a meal. 03/13/17   [provider]  tadalafil (CIALIS) 5 MG tablet TAKE 1 TABLET BY MOUTH ONCE DAILY AS NEEDED FOR ERECTILE DYSFUNCTION 07/22/19   [provider]  telmisartan-hydrochlorothiazide (MICARDIS HCT) 80-25 MG tablet  08/06/19   [provider]  terbinafine (LAMISIL) 250 MG tablet Take 1 tablet (250 mg total) by mouth daily. 04/29/17   Wallene Huh, DPM  triamcinolone ointment (KENALOG) 0.1 % APPLY TO AFFECTED AREA TWICE A DAY FOR 15 DAYS 02/24/19   [provider]  valsartan-hydrochlorothiazide (DIOVAN-HCT) 320-25 MG tablet TAKE 1 TABLET ONCE A DAY FOR BLOOD PRESSURE 02/27/17   [provider]     Allergies    Penicillins  Review of Systems   Review of Systems  Constitutional: Negative for fever.  HENT: Negative for rhinorrhea and sore throat.   Eyes: Negative for redness.  Respiratory: Negative for cough.   Cardiovascular: Negative for chest pain.  Gastrointestinal: Negative for abdominal pain, diarrhea, nausea and vomiting.  Endocrine: Positive for polydipsia and polyuria.  Genitourinary: Positive for frequency. Negative for dysuria and hematuria.  Musculoskeletal: Negative for myalgias.  Skin: Negative for rash.  Neurological: Negative for headaches.    Physical Exam Updated Vital Signs BP (!) 135/98   Pulse 70   Temp 98.8 F (37.1 C) (Oral)   Resp 16   Ht 6\' 1"  (1.854 m)   Wt 87.5 kg   SpO2 96%   BMI 25.46 kg/m   Physical Exam Vitals and nursing note reviewed.  Constitutional:      Appearance: He is well-developed.  HENT:     Head: Normocephalic and atraumatic.     Mouth/Throat:     Mouth: Mucous membranes are dry.     Comments: Oropharyngeal thrush noted.   Eyes:     General:        Right eye: No discharge.        Left eye: No discharge.     Conjunctiva/sclera: Conjunctivae normal.  Cardiovascular:     Rate and Rhythm: Normal rate and regular rhythm.     Heart sounds: Normal heart sounds.  Pulmonary:     Effort: Pulmonary effort is normal.     Breath sounds: Normal breath sounds.  Abdominal:     Palpations: Abdomen is soft.     Tenderness: There is no abdominal tenderness. There is no guarding or rebound.  Musculoskeletal:     Cervical back: Normal range of motion and neck supple.  Skin:    General: Skin is warm and dry.  Neurological:     Mental Status: He is alert.     ED Results / Procedures / Treatments   Labs (all labs ordered are listed, but only abnormal results are displayed) Labs Reviewed  BASIC METABOLIC PANEL - Abnormal; Notable for the following components:      Result Value   Sodium 126 (*)    Chloride 88 (*)     Glucose, Bld 601 (*)    Creatinine, Ser 1.46 (*)    GFR calc non Af Amer 51 (*)    GFR calc Af Amer 59 (*)    All other components within normal limits  CBC - Abnormal; Notable for the following components:   HCT 38.9 (*)    MCV 74.8 (*)    All other components within normal limits  URINALYSIS, ROUTINE W REFLEX MICROSCOPIC - Abnormal; Notable for the following components:   Glucose, UA >=500 (*)  Ketones, ur 15 (*)    All other components within normal limits  URINALYSIS, MICROSCOPIC (REFLEX) - Abnormal; Notable for the following components:   Bacteria, UA FEW (*)    All other components within normal limits  CBG MONITORING, ED - Abnormal; Notable for the following components:   Glucose-Capillary >600 (*)    All other components within normal limits  CBG MONITORING, ED - Abnormal; Notable for the following components:   Glucose-Capillary 441 (*)    All other components within normal limits  HEMOGLOBIN A1C    EKG None  Radiology No results found.  Procedures Procedures (including critical care time)  Medications Ordered in ED Medications  sodium chloride 0.9 % bolus 1,000 mL (0 mLs Intravenous Stopped 07/16/20 1817)  sodium chloride 0.9 % bolus 1,000 mL (1,000 mLs Intravenous New Bag/Given 07/16/20 1817)  insulin aspart protamine- aspart (NOVOLOG MIX 70/30) injection 10 Units (10 Units Subcutaneous Given 07/16/20 1739)  living well with diabetes book MISC ( Does not apply Given 07/16/20 1834)    ED Course  I have reviewed the triage vital signs and the nursing notes.  Pertinent labs & imaging results that were available during my care of the patient were reviewed by me and considered in my medical decision making (see chart for details).  Patient seen and examined.  Patient with elevated blood sugar.  Normal bicarb and anion gap without any clinical suspicions for DKA.  Patient will need initiated on therapy for type 2 diabetes.  Will give 2 L normal saline and  subcutaneous insulin and reassess.  Patient looks well.   Vital signs reviewed and are as follows: BP (!) 135/98   Pulse 70   Temp 98.8 F (37.1 C) (Oral)   Resp 16   Ht 6\' 1"  (1.854 m)   Wt 87.5 kg   SpO2 96%   BMI 25.46 kg/m   7:07 PM patient continues to do well.  He was reading his diabetes literature.  Sugar improving.  Signout to [employer] at shift change.  Plan: Recheck CBG after second liter of IV fluids.  Plan for discharged home with Metformin if improved.  Patient knows to call PCP for close follow-up.  PCP office already provided with prescription for thrush treatment.    MDM Rules/Calculators/A&P                          Patient with new onset diabetes, no concern for DKA given lab work-up today.  Patient treated with IV fluids and subcutaneous insulin with improvement in sugar.  Patient will be started on twice daily Metformin which she has had in the past for borderline diabetes.  Patient looks well.  He has appropriate follow-up.  No indications for admission.   Final Clinical Impression(s) / ED Diagnoses Final diagnoses:  Hyperglycemia  Oral thrush    Rx / DC Orders ED Discharge Orders         Ordered    metFORMIN (GLUCOPHAGE) 500 MG tablet  2 times daily with meals        07/16/20 1827           Carlisle Cater, PA-C 07/16/20 1910    Maudie Flakes, MD 07/16/20 2316

## 2020-08-23 ENCOUNTER — Ambulatory Visit: Payer: 59 | Admitting: Dietician

## 2020-08-30 ENCOUNTER — Other Ambulatory Visit: Payer: Self-pay

## 2020-08-30 ENCOUNTER — Encounter: Payer: Self-pay | Admitting: Dietician

## 2020-08-30 ENCOUNTER — Encounter: Payer: 59 | Attending: Family Medicine | Admitting: Dietician

## 2020-08-30 DIAGNOSIS — E119 Type 2 diabetes mellitus without complications: Secondary | ICD-10-CM | POA: Insufficient documentation

## 2020-08-30 NOTE — Progress Notes (Signed)
Diabetes Self-Management Education  Visit Type: First/Initial  Appt. Start Time: 1515 Appt. End Time: 1620  08/30/2020  Daniel Yoder, identified by name and date of birth, is a 64 y.o. male with a diagnosis of Diabetes: Type 2.   ASSESSMENT  Pt reports not knowing he was a diabetic and suddenly noticed 3 months ago he was urinating frequently and extremely thirsty. Also experienced interruptions in his vision as well. Pt wife (a type 2 diabetic) checked his BG, and it was 447. Pt went to ER where BG was over 600. Pt reports past provider missed his diagnosis of diabetes. Pt reports hating pricking his finger to check his BG. He is checking his blood sugar every morning. FBG was 200 today, but usually 150-160. Pt was previously checking BG before meals, but not after. Pt reports quitting drinking soda a month ago, and now drinks unsweet tea. States that his taste has changed since, and can't drink soda or sweet tea or eat sweet foods anymore. Claims they are way too sweet, and upset his stomach. Pt reports trying Coke ZERO and almost got sick from drinking it, made him very nauseas. Occasionally drinks Gatorade "lite". Mostly drinks water, unsweetened tea, and Hint water. Pt doesn't usually snack. Eats breakfast at his desk at work. Eats three meals a day. May have townhouse crackers with spinach dip before bed. Pt changed medication today. Is no longer taking metFormin, Januvia, or Basaglar Insulin. Is now taking a Synjardy combination drug.   There were no vitals taken for this visit. There is no height or weight on file to calculate BMI.   Diabetes Self-Management Education - 08/30/20 1553      Visit Information   Visit Type First/Initial      Initial Visit   Diabetes Type Type 2    Are you currently following a meal plan? No    Are you taking your medications as prescribed? Yes    Date Diagnosed 02/2020      Health Coping   How would you rate your overall health? Good        Psychosocial Assessment   Patient Belief/Attitude about Diabetes Motivated to manage diabetes    Self-care barriers None    Self-management support Doctor's office;Family    Other persons present Patient    Patient Concerns Nutrition/Meal planning    Special Needs None    Preferred Learning Style No preference indicated    Learning Readiness Ready    How often do you need to have someone help you when you read instructions, pamphlets, or other written materials from your doctor or pharmacy? 1 - Never    What is the last grade level you completed in school? high school      Pre-Education Assessment   Patient understands the diabetes disease and treatment process. Needs Instruction    Patient understands incorporating nutritional management into lifestyle. Needs Instruction    Patient undertands incorporating physical activity into lifestyle. Needs Instruction    Patient understands using medications safely. Needs Instruction    Patient understands monitoring blood glucose, interpreting and using results Needs Instruction    Patient understands prevention, detection, and treatment of acute complications. Needs Instruction    Patient understands prevention, detection, and treatment of chronic complications. Needs Instruction    Patient understands how to develop strategies to address psychosocial issues. Needs Instruction    Patient understands how to develop strategies to promote health/change behavior. Needs Instruction      Complications  Last HgB A1C per patient/outside source 6.7 %   06/11/2020   How often do you check your blood sugar? 1-2 times/day    Fasting Blood glucose range (mg/dL) 130-179    Postprandial Blood glucose range (mg/dL) --   Pt was only checking before meals.   Number of hypoglycemic episodes per month 0    Number of hyperglycemic episodes per week 0   None since admission to ER   Have you had a dilated eye exam in the past 12 months? Yes    Have you had a  dental exam in the past 12 months? Yes    Are you checking your feet? Yes    How many days per week are you checking your feet? 6      Dietary Intake   Breakfast Breakfast bowl with sausage, egg, bacon, tater tots. Unsweetend tea    Snack (morning) none    Lunch [employer] chicken, baked sweet potato, salad with ranch. unsweetened tea    Snack (afternoon) none    Dinner [employer], green beans, pinto beans. tea or Hint water    Snack (evening) none    Beverage(s) water, unsweetened tea      Exercise   Exercise Type ADL's      Patient Education   Previous Diabetes Education No    Disease state  Definition of diabetes, type 1 and 2, and the diagnosis of diabetes;Factors that contribute to the development of diabetes    Nutrition management  Food label reading, portion sizes and measuring food.;Carbohydrate counting;Role of diet in the treatment of diabetes and the relationship between the three main macronutrients and blood glucose level    Physical activity and exercise  Role of exercise on diabetes management, blood pressure control and cardiac health.    Medications Reviewed patients medication for diabetes, action, purpose, timing of dose and side effects.    Monitoring Purpose and frequency of SMBG.;Taught/discussed recording of test results and interpretation of SMBG.    Acute complications Discussed and identified patients' treatment of hyperglycemia.    Chronic complications Assessed and discussed foot care and prevention of foot problems    Psychosocial adjustment Worked with patient to identify barriers to care and solutions;Helped patient identify a support system for diabetes management      Individualized Goals (developed by patient)   Nutrition Follow meal plan discussed    Physical Activity Not Applicable    Medications take my medication as prescribed    Monitoring  test my blood glucose as discussed      Post-Education Assessment   Patient  understands the diabetes disease and treatment process. Needs Review    Patient understands incorporating nutritional management into lifestyle. Needs Review    Patient undertands incorporating physical activity into lifestyle. Needs Review    Patient understands using medications safely. Needs Review    Patient understands monitoring blood glucose, interpreting and using results Needs Review    Patient understands prevention, detection, and treatment of acute complications. Needs Review    Patient understands prevention, detection, and treatment of chronic complications. Needs Review    Patient understands how to develop strategies to address psychosocial issues. Needs Review    Patient understands how to develop strategies to promote health/change behavior. Needs Review      Outcomes   Expected Outcomes Demonstrated interest in learning. Expect positive outcomes    Future DMSE 4-6 wks    Program Status Not Completed  Individualized Plan for Diabetes Self-Management Training:   Learning Objective:  Patient will have a greater understanding of diabetes self-management. Patient education plan is to attend individual and/or group sessions per assessed needs and concerns.   Plan:   Patient Instructions  Check your blood sugar in the morning, and 2 hours after you start eating a meal!  Eat 60g of carbs at each meal This is 4 CARB CHOICES.  Then choose your protein to pair it with. Finally, fill the remaining portion of your meal with non-starchy vegetables.  If you snack, make it no more than 1 carb choice (15g) and pair it with a protein!   Expected Outcomes:  Demonstrated interest in learning. Expect positive outcomes  Education material provided: ADA - How to Thrive: A Guide for Your Journey with Diabetes, Meal plan card and My Plate  If problems or questions, patient to contact team via:  Phone and Email  Future DSME appointment: 4-6 wks

## 2020-08-30 NOTE — Patient Instructions (Addendum)
Check your blood sugar in the morning, and 2 hours after you start eating a meal!  Eat 60g of carbs at each meal This is 4 CARB CHOICES.  Then choose your protein to pair it with. Finally, fill the remaining portion of your meal with non-starchy vegetables.  If you snack, make it no more than 1 carb choice (15g) and pair it with a protein!

## 2020-10-04 ENCOUNTER — Ambulatory Visit: Payer: 59 | Admitting: Dietician

## 2021-07-05 ENCOUNTER — Encounter: Payer: Self-pay | Admitting: Neurology

## 2021-07-05 ENCOUNTER — Ambulatory Visit: Payer: 59 | Admitting: Neurology

## 2021-07-05 VITALS — BP 152/89 | HR 89 | Ht 73.0 in | Wt 209.0 lb

## 2021-07-05 DIAGNOSIS — G40109 Localization-related (focal) (partial) symptomatic epilepsy and epileptic syndromes with simple partial seizures, not intractable, without status epilepticus: Secondary | ICD-10-CM | POA: Diagnosis not present

## 2021-07-05 MED ORDER — LAMOTRIGINE 25 MG PO TABS: 25.0000 mg | ORAL_TABLET | Freq: Two times a day (BID) | ORAL | 0 refills | Status: DC

## 2021-07-05 NOTE — Patient Instructions (Addendum)
You have been started on Lamotrigine (Lamictal), please use as directed below  Week 1: '25mg'$  (one pill) at night  Week 2: '25mg'$  (one pill) twice daily Week 3: '25mg'$  (one pill) morning and '50mg'$  (two pills) night  Week 4: '50mg'$  (two pills) twice daily  Week 5: '75mg'$  (three pills) twice daily Week 6: '100mg'$  (fours pills) twice daily  Will obtain a Lamotrigine level (blood test) and complete metabolic panel after week 6  Please stop the medication and call the office as soon as you develop a new rash  Ambulatory EEG  MRI Brain with and without contrast  Return to clinic in 6 to 8 weeks for follow up.     Per [facility] statutes, patients with seizures are not allowed to drive until they have been seizure-free for six months.  Other recommendations include using caution when using heavy equipment or power tools. Avoid working on ladders or at heights. Take showers instead of baths.  Do not swim alone.  Ensure the water temperature is not too high on the home water heater. Do not go swimming alone. Do not lock yourself in a room alone (i.e. bathroom). When caring for infants or small children, sit down when holding, feeding, or changing them to minimize risk of injury to the child in the event you have a seizure. Maintain good sleep hygiene. Avoid alcohol.  Also recommend adequate sleep, hydration, good diet and minimize stress.   During the Seizure  - First, ensure adequate ventilation and place patients on the floor on their left side  Loosen clothing around the neck and ensure the airway is patent. If the patient is clenching the teeth, do not force the mouth open with any object as this can cause severe damage - Remove all items from the surrounding that can be hazardous. The patient may be oblivious to what's happening and may not even know what he or she is doing. If the patient is confused and wandering, either gently guide him/her away and block access to outside areas - Reassure the  individual and be comforting - Call 911. In most cases, the seizure ends before EMS arrives. However, there are cases when seizures may last over 3 to 5 minutes. Or the individual may have developed breathing difficulties or severe injuries. If a pregnant patient or a person with diabetes develops a seizure, it is prudent to call an ambulance. - Finally, if the patient does not regain full consciousness, then call EMS. Most patients will remain confused for about 45 to 90 minutes after a seizure, so you must use judgment in calling for help. - Avoid restraints but make sure the patient is in a bed with padded side rails - Place the individual in a lateral position with the neck slightly flexed; this will help the saliva drain from the mouth and prevent the tongue from falling backward - Remove all nearby furniture and other hazards from the area - Provide verbal assurance as the individual is regaining consciousness - Provide the patient with privacy if possible - Call for help and start treatment as ordered by the caregiver   After the Seizure (Postictal Stage)  After a seizure, most patients experience confusion, fatigue, muscle pain and/or a headache. Thus, one should permit the individual to sleep. For the next few days, reassurance is essential. Being calm and helping reorient the person is also of importance.  Most seizures are painless and end spontaneously. Seizures are not harmful to others but can lead  to complications such as stress on the lungs, brain and the heart. Individuals with prior lung problems may develop labored breathing and respiratory distress.

## 2021-07-05 NOTE — Progress Notes (Signed)
[facility]  PATIENT: Daniel Yoder DOB: Jan 04, 1956  REFERRING CLINICIAN: Vernie Shanks, MD HISTORY FROM: Patient  REASON FOR VISIT: Seizure/Temporal lobe epilepsy.    HISTORICAL  CHIEF COMPLAINT:  Chief Complaint  Patient presents with   New Patient (Initial Visit)    Pt states he quit taking seizure medication 15 years ago with no events, 6 weeks ago he started smelling odor that will not go away, states it occurs 1-2 times a weeks , concerned his seizures may be coming back     HISTORY OF PRESENT ILLNESS:  This is a 65 year old male with past medical history of diabetes, hypertension, hyperlipidemia, focal seizure diagnosed 20 years ago who is presenting for new symptoms concerning for seizure.  Patient said that 20 years ago he was treated for temporal lobe epilepsy.  At that time he was having a funny sense of smell described as smelling burning metal.  He was seen by neurology had a MRI brain and EEG which was abnormal and patient was started on medication.  He reported he was trialed on 3 different antiseizure medications, does not remember their names but the last medication was able to control the seizures.  After a few years being seizure-free he discontinued medication and has not had had any seizures until about a month and a half ago.  He said about a month and a half ago he was driving on the highway and had a exhaust pipe smell he thought it was the car in front of him that was admitting that smell but since after being home this smell linger.  He mentions since then he is on average about once a week he will have the same exact exhaust pipe smell and he thought that this could have been his seizures coming back therefore he presented to his primary care doctor who referred him to neurology.  These events last a few seconds to a few minute, he cannot pinpoint a trigger.  He denies any loss of consciousness with this event, denies any convulsion.  Mentioned  that his seizures are always during sensory aware seizure.  He also reported that wife has noted that he has twitching of his right arm.     Handedness: Right   Seizure Type: Focal aware (bad smell)   Current frequency: once a week   Any injuries from seizures: None   Seizure risk factors: Head injury 23 years ago, tree branch fell on top of his head. Father with seizure  Previous ASMs: tried 3 ASM, does not remember the name   Currenty ASMs: None   ASMs side effects: Depressed   Brain Images:  Not available   Previous EEGs: Not available    OTHER MEDICAL CONDITIONS: DMII, HTN, HLD  REVIEW OF SYSTEMS: Full 14 system review of systems performed and negative with exception of: as noted in the HPI  ALLERGIES: Allergies  Allergen Reactions   Penicillins Nausea Only    HOME MEDICATIONS: Outpatient Medications Prior to Visit  Medication Sig Dispense Refill   diclofenac (VOLTAREN) 75 MG EC tablet Take 75 mg by mouth daily.     diltiazem (CARDIZEM CD) 120 MG 24 hr capsule Take 120 mg by mouth daily.     Empagliflozin-metFORMIN HCl ER (SYNJARDY XR) 12.03-999 MG TB24 1 tablet     meloxicam (MOBIC) 7.5 MG tablet TAKE 1 TABLET BY MOUTH ONCE A DAY AS NEEDED FOR PAIN WITH FOOD     rosuvastatin (CRESTOR) 10 MG tablet Take 10  mg by mouth at bedtime.     tadalafil (CIALIS) 5 MG tablet TAKE 1 TABLET BY MOUTH ONCE DAILY AS NEEDED FOR ERECTILE DYSFUNCTION     valsartan-hydrochlorothiazide (DIOVAN-HCT) 320-25 MG tablet TAKE 1 TABLET ONCE A DAY FOR BLOOD PRESSURE  12   atorvastatin (LIPITOR) 10 MG tablet      SYNJARDY XR 12.03-999 MG TB24 Take by mouth.     amLODipine (NORVASC) 10 MG tablet Take 1 tablet (10 mg total) by mouth at bedtime. 90 tablet 3   carvedilol (COREG) 25 MG tablet Take 1 tablet (25 mg total) by mouth 2 (two) times daily with a meal. 60 tablet 1   metFORMIN (GLUCOPHAGE) 500 MG tablet Take 1 tablet (500 mg total) by mouth 2 (two) times daily with a meal. (Patient not  taking: Reported on 08/30/2020) 60 tablet 0   telmisartan-hydrochlorothiazide (MICARDIS HCT) 80-25 MG tablet      terbinafine (LAMISIL) 250 MG tablet Take 1 tablet (250 mg total) by mouth daily. 90 tablet 0   triamcinolone ointment (KENALOG) 0.1 % APPLY TO AFFECTED AREA TWICE A DAY FOR 15 DAYS     No facility-administered medications prior to visit.    PAST MEDICAL HISTORY: Past Medical History:  Diagnosis Date   Hypertension    WPW (Wolff-Parkinson-White syndrome) 2003   Ablation 2003    PAST SURGICAL HISTORY: History reviewed. No pertinent surgical history.  FAMILY HISTORY: Family History  Problem Relation Age of Onset   Hypertension Brother    Diabetes Mellitus II Brother    Heart disease Brother        Prostate Cancer   Diabetes Mellitus II Brother    Hypertension Brother    Hypertension Mother    Heart disease Mother        CHF   Stroke Father    Hypertension Father    Diabetes Mellitus II Father     SOCIAL HISTORY: Social History   Socioeconomic History   Marital status: Married    Spouse name: Not on file   Number of children: Not on file   Years of education: Not on file   Highest education level: Not on file  Occupational History   Not on file  Tobacco Use   Smoking status: Former    Packs/day: 0.00    Years: 30.00    Pack years: 0.00    Types: Cigarettes   Smokeless tobacco: Never  Vaping Use   Vaping Use: Never used  Substance and Sexual Activity   Alcohol use: No    Alcohol/week: 0.0 standard drinks   Drug use: No   Sexual activity: Not on file  Other Topics Concern   Not on file  Social History Narrative   Not on file   Social Determinants of Health   Financial Resource Strain: Not on file  Food Insecurity: Not on file  Transportation Needs: Not on file  Physical Activity: Not on file  Stress: Not on file  Social Connections: Not on file  Intimate Partner Violence: Not on file     PHYSICAL EXAM -GENERAL  EXAM/CONSTITUTIONAL: Vitals:  Vitals:   07/05/21 0848  BP: (!) 152/89  Pulse: 89  Weight: 209 lb (94.8 kg)  Height: _0  (1.854 m)   Body mass index is 27.57 kg/m. Wt Readings from Last 3 Encounters:  07/05/21 209 lb (94.8 kg)  07/16/20 193 lb (87.5 kg)  11/29/15 204 lb (92.5 kg)   Patient is in no distress; well developed, nourished and groomed; neck  is supple  CARDIOVASCULAR: Examination of carotid arteries is normal; no carotid bruits Regular rate and rhythm, no murmurs Examination of peripheral vascular system by observation and palpation is normal  EYES: Pupils round and reactive to light, Visual fields full to confrontation, Extraocular movements intacts,   MUSCULOSKELETAL: Gait, strength, tone, movements noted in Neurologic exam below  NEUROLOGIC: MENTAL STATUS:  awake, alert, oriented to person, place and time recent and remote memory intact normal attention and concentration language fluent, comprehension intact, naming intact fund of knowledge appropriate  CRANIAL NERVE:  2nd, 3rd, 4th, 6th - pupils equal and reactive to light, visual fields full to confrontation, extraocular muscles intact, no nystagmus 5th - facial sensation symmetric 7th - facial strength symmetric 8th - hearing intact 9th - palate elevates symmetrically, uvula midline 11th - shoulder shrug symmetric 12th - tongue protrusion midline  MOTOR:  normal bulk and tone, full strength in the BUE, BLE  SENSORY:  normal and symmetric to light touch, pinprick, temperature, vibration  COORDINATION:  finger-nose-finger, fine finger movements normal  REFLEXES:  deep tendon reflexes present and symmetric  GAIT/STATION:  normal   DIAGNOSTIC DATA (LABS, IMAGING, TESTING) - I reviewed patient records, labs, notes, testing and imaging myself where available.  Lab Results  Component Value Date   WBC 9.4 07/16/2020   HGB 13.6 07/16/2020   HCT 38.9 (L) 07/16/2020   MCV 74.8 (L) 07/16/2020    PLT 329 07/16/2020      Component Value Date/Time   NA 126 (L) 07/16/2020 1435   K 5.0 07/16/2020 1435   CL 88 (L) 07/16/2020 1435   CO2 24 07/16/2020 1435   GLUCOSE 601 (HH) 07/16/2020 1435   BUN 22 07/16/2020 1435   CREATININE 1.46 (H) 07/16/2020 1435   CALCIUM 9.4 07/16/2020 1435   PROT 6.3 01/04/2009 1552   ALBUMIN 3.7 01/04/2009 1552   AST 16 01/04/2009 1552   ALT 15 01/04/2009 1552   ALKPHOS 86 01/04/2009 1552   BILITOT 0.5 01/04/2009 1552   GFRNONAA 51 (L) 07/16/2020 1435   GFRAA 59 (L) 07/16/2020 1435   Lab Results  Component Value Date   CHOL 187 11/17/2015   HDL 30 (L) 11/17/2015   LDLCALC 108 (H) 11/17/2015   TRIG 244 (H) 11/17/2015   Lab Results  Component Value Date   HGBA1C 13.7 (H) 07/16/2020   No results found for: VITAMINB12 No results found for: TSH  No brain images or EEG available for review.   ASSESSMENT AND PLAN  65 y.o. year old male  with past medical history of left temporal seizure, hypertension, hyperlipidemia, diabetes who is presenting for new event concerning for seizures.  Patient stated about 20 years ago he was diagnosed with left temporal seizure after presenting with altered smell.  At that time he described smell as burning metal.  He was treated with antiseizure medication after a few years of being seizure-free the medication was discontinued and he was seizure-free until about a month and a half ago.  Starting about a month and a half ago, he started having odd smell described as exhaust pipe smell, currently occurring about once per week.  Episode lasted about few seconds to a few minutes, no generalized convulsion associated with it, no loss of awareness.  Patient episodes are concerning for seizures, I do think this is a recurrence of his seizure disorder.  I will order him a MRI brain with and without contrast and a ambulatory EEG.  I will also start him on  lamotrigine.  I will see him in 6 weeks for follow-up at that time we will  get a lamotrigine level.  I also advised the patient to contact his previous neurologist and try to get record of the previous antiseizure medications that were started.  All questions and concerns addressed.    1. Focal epilepsy ([place])   2. Temporal lobe seizure ([facility])       PLAN: You have been started on Lamotrigine (Lamictal), please use as directed below  Week 1: 31m (one pill) at night  Week 2: 237m(one pill) twice daily Week 3: 2588mone pill) morning and 16m76mwo pills) night  Week 4: 16mg67mo pills) twice daily  Week 5: 75mg 45mee pills) twice daily Week 6: 100mg (8ms pills) twice daily  Will obtain a Lamotrigine level (blood test) and complete metabolic panel after week 6  Please stop the medication and call the office as soon as you develop a new rash  Ambulatory EEG  MRI Brain with and without contrast  Return to clinic in 6 to 8 weeks for follow up.    Per [facility], patients with seizures are not allowed to drive until they have been seizure-free for six months.  Other recommendations include using caution when using heavy equipment or power tools. Avoid working on ladders or at heights. Take showers instead of baths.  Do not swim alone.  Ensure the water temperature is not too high on the home water heater. Do not go swimming alone. Do not lock yourself in a room alone (i.e. bathroom). When caring for infants or small children, sit down when holding, feeding, or changing them to minimize risk of injury to the child in the event you have a seizure. Maintain good sleep hygiene. Avoid alcohol.  Also recommend adequate sleep, hydration, good diet and minimize stress.   During the Seizure  - First, ensure adequate ventilation and place patients on the floor on their left side  Loosen clothing around the neck and ensure the airway is patent. If the patient is clenching the teeth, do not force the mouth open with any object as this can cause severe  damage - Remove all items from the surrounding that can be hazardous. The patient may be oblivious to what's happening and may not even know what he or she is doing. If the patient is confused and wandering, either gently guide him/her away and block access to outside areas - Reassure the individual and be comforting - Call 911. In most cases, the seizure ends before EMS arrives. However, there are cases when seizures may last over 3 to 5 minutes. Or the individual may have developed breathing difficulties or severe injuries. If a pregnant patient or a person with diabetes develops a seizure, it is prudent to call an ambulance. - Finally, if the patient does not regain full consciousness, then call EMS. Most patients will remain confused for about 45 to 90 minutes after a seizure, so you must use judgment in calling for help. - Avoid restraints but make sure the patient is in a bed with padded side rails - Place the individual in a lateral position with the neck slightly flexed; this will help the saliva drain from the mouth and prevent the tongue from falling backward - Remove all nearby furniture and other hazards from the area - Provide verbal assurance as the individual is regaining consciousness - Provide the patient with privacy if possible - Call for help and start  treatment as ordered by the caregiver   After the Seizure (Postictal Stage)  After a seizure, most patients experience confusion, fatigue, muscle pain and/or a headache. Thus, one should permit the individual to sleep. For the next few days, reassurance is essential. Being calm and helping reorient the person is also of importance.  Most seizures are painless and end spontaneously. Seizures are not harmful to others but can lead to complications such as stress on the lungs, brain and the heart. Individuals with prior lung problems may develop labored breathing and respiratory distress.     Orders Placed This Encounter   Procedures   MR BRAIN W WO CONTRAST   EEG adult    Meds ordered this encounter  Medications   lamoTRIgine (LAMICTAL) 25 MG tablet    Sig: Take 1 tablet (25 mg total) by mouth 2 (two) times daily.    Dispense:  180 tablet    Refill:  0    Return in about 8 weeks (around 08/30/2021).    Alric Ran, MD 07/05/2021, 9:40 AM  [facility] [address], [place], [place] [address] [phone]

## 2021-07-11 ENCOUNTER — Telehealth: Payer: Self-pay | Admitting: Neurology

## 2021-07-11 NOTE — Telephone Encounter (Signed)
48 hr EEG order faxed to [facility], they will reach out to patient to schedule.

## 2021-07-15 NOTE — Telephone Encounter (Signed)
Pt is scheduled for his In-Home Video EEG from (9/27 - 08/01/21), per his request, with [facility].

## 2021-07-17 ENCOUNTER — Ambulatory Visit: Payer: 59

## 2021-07-17 ENCOUNTER — Other Ambulatory Visit: Payer: Self-pay

## 2021-07-17 DIAGNOSIS — G40109 Localization-related (focal) (partial) symptomatic epilepsy and epileptic syndromes with simple partial seizures, not intractable, without status epilepticus: Secondary | ICD-10-CM

## 2021-07-17 MED ORDER — GADOBENATE DIMEGLUMINE 529 MG/ML IV SOLN
20.0000 mL | Freq: Once | INTRAVENOUS | Status: AC | PRN
  Administered 2021-07-17: 20 mL via INTRAVENOUS

## 2021-07-30 DIAGNOSIS — G40109 Localization-related (focal) (partial) symptomatic epilepsy and epileptic syndromes with simple partial seizures, not intractable, without status epilepticus: Secondary | ICD-10-CM

## 2021-07-31 DIAGNOSIS — G40109 Localization-related (focal) (partial) symptomatic epilepsy and epileptic syndromes with simple partial seizures, not intractable, without status epilepticus: Secondary | ICD-10-CM | POA: Diagnosis not present

## 2021-08-04 ENCOUNTER — Encounter: Payer: Self-pay | Admitting: Neurology

## 2021-08-04 NOTE — Progress Notes (Signed)
Amb EEG completed; There is presence of intermittent left temporal slowing and left temp sharps which are consistent with an area of left temporal epileptogenic potential.    Alric Ran, MD

## 2021-08-11 ENCOUNTER — Other Ambulatory Visit: Payer: Self-pay | Admitting: Neurology

## 2021-08-11 DIAGNOSIS — G40109 Localization-related (focal) (partial) symptomatic epilepsy and epileptic syndromes with simple partial seizures, not intractable, without status epilepticus: Secondary | ICD-10-CM

## 2021-08-11 NOTE — Procedures (Signed)
    This RVEEG was performed using equipment provided by [employer] utilizing Bluetooth ( Trackit ) amplifiers with EEGT attended video collected and performed with a 19-channel digital VEEG, Data was acquired with a minimum of 21 bipolar connections and sampled at a minimum rate of 250 cycles per second per channel and one channel for EKG, obtained according to 10-20 international electrode placement system, reformatted digitally into referential and bipolar montages, reviewed and verified for accuracy and validity by the governing reading neurologist in full details.   Activating Procedure:  Photic Stimulation was Performed. Photic Driving Response not seen. Hyperventilation was not performed due to pt contraindication of Droplet Precaution-COVID-19.   No clear epileptiform activity was detected by the reviewing neurodiagnostic technologist for further evaluation.   Intermittent artifacts could not be resolved. All impedance under 10K ohms.   Pt is awake, alert, cooperative and orientated Xs 4 of 4? correct.   EEGT Attended Video Recording: Yes   Hardware - (Trackit) by [place] Description:  This RVEEG was obtained during awake and drowsy states, during wakefulness posterior dominant rhythm was 10 Hz, alpha, well-modulated, moderate in voltage, and attenuated appropriately with eye opening. During the recording there was no focal or lateralized epileptiform activity or focal slowing noted. Drowsiness was marked by stage I sleep. Hyperventilation was not performed due to pt contraindication of Droplet Precaution. Photic stimulation resulted in posterior driving responses at multiple frequencies, without any abnormal photo paroxysmal responses. EKG normal sinus rhythm.    RVEEG Interpretations: This is a normal routine EEG. However, a normal EEG does not exclude a diagnosis of epilepsy    Alric Ran, MD [facility]

## 2021-08-11 NOTE — Procedures (Signed)
   Description 48 Hours Ambulatory Video EEG   TECHNICAL DESCRIPTION: This AVEEG was performed using equipment provided by [employer] utilizing Bluetooth (Trackit ) amplifiers with continuous EEGT attended video collection using encrypted remote transmission via [place] secured cellular tower network with data rates for each AVEEG performed. This is a Biomedical engineer AVEEG, obtained, according to the 10-20 international electrode placement system, reformatted digitally into referential and bipolar montages. Data was acquired with a minimum of 21 bipolar connections and sampled at a minimum rate of 250 cycles per second per channel, maximum rate of 450 cycles per second per channel and two channels for EKG. The entire AVEEG study was recorded through cable and or radio telemetry for subsequent analysis. Specified epochs of the AVEEG data were identified at the direction of the subject by the depression of a push button by the patient. Each patient's event file included data acquired two minutes prior to the push button activation and continuing until two minutes afterwards. AVEEG files were reviewed on [employer] Platform by [employer] with a digital high frequency filter set at 70 Hz and a low frequency filter set at 0.1 Hz with a paper speed of 22mm/s resulting in 10 seconds per digital page. This entire AVEEG was reviewed by the EEG Technologist. Random time samples, random sleep samples, clips, patient initiated push button files with included patient daily diary logs, EEG Technologist bookmarked note data was reviewed and verified for accuracy and validity by the governing reading neurologist in full details. This AEEGV was fully compliant with all requirements for CPT 97500 for setup, patient education, take down and administered by an EEG technologist.   INTERMITTENT MONITORING WITH VIDEO TECHNICAL DESCRIPTION:  This Long-Term AVEEG was monitored intermittently by a qualified EEG technologist for the  entirety of the recording; quality check-ins were performed at a minimum of every two hours, checking and documenting real-time data and video to assure the integrity and quality of the recording (e.g., camera position, electrode integrity and impedance), and identify the need for maintenance. For intermittent monitoring, an EEG Technologist monitored no more than 12 patients concurrently. Diagnostic video was captured at least 80% of the time during the recording.   TECHNOLOGIST EVENTS: Notes for potential left temporal theta activity, sharply contoured morphology vs wicket spikes were detected by the reviewing neurodiagnostic technologist during the recording for further evaluation.  PATIENT EVENTS:  The patient pushed the event button 0 times during the AVEEG recording for further evaluation. However, EEGT test press #2 times with pt at the beginning of the tracing.  TIME SAMPLES: 1 minute of every hour recorded are reviewed as random time samples.   SLEEP SAMPLES: 5 minutes of every 24 hour recorded sleep cycle are reviewed as random sleep samples.   BACKGROUND EEG: This AVEEG consists of well-modulated bilateral synchronous and symmetrical background in the alpha frequencies in the awake state.  AWAKE: The posterior dominant rhythm was characterized by symmetric and reactive 10 Hz activity with eyes closed.   SLEEP: Stage two sleep displayed Sleep Spindles and Vertex Waveform components. All other sleep stages appeared symmetrical and synchronous with regards to K-Complexes, and REM.  EKG: Normal sinus rhythm.  AVEEG Interpretations: This is an abnormal ambulatory VEEG due to left temporal intermittent slowing and left temporal sharps. This is consistent with an area of epileptogenic potential in the left temporal lobe.  No events were recorded.    Alric Ran, MD [facility]

## 2021-09-04 ENCOUNTER — Encounter: Payer: Self-pay | Admitting: Neurology

## 2021-09-04 ENCOUNTER — Other Ambulatory Visit: Payer: Self-pay

## 2021-09-04 ENCOUNTER — Ambulatory Visit: Payer: 59 | Admitting: Neurology

## 2021-09-04 VITALS — BP 137/89 | HR 96 | Ht 73.0 in | Wt 210.0 lb

## 2021-09-04 DIAGNOSIS — G40109 Localization-related (focal) (partial) symptomatic epilepsy and epileptic syndromes with simple partial seizures, not intractable, without status epilepticus: Secondary | ICD-10-CM

## 2021-09-04 DIAGNOSIS — Z5181 Encounter for therapeutic drug level monitoring: Secondary | ICD-10-CM

## 2021-09-04 MED ORDER — LAMOTRIGINE 100 MG PO TABS: 100.0000 mg | ORAL_TABLET | Freq: Two times a day (BID) | ORAL | 4 refills | Status: DC

## 2021-09-04 NOTE — Progress Notes (Signed)
[facility]  PATIENT: Daniel Yoder DOB: 1956/04/17  REFERRING CLINICIAN: Gaynelle Arabian, MD HISTORY FROM: Patient  REASON FOR VISIT: Seizure/Temporal lobe epilepsy.    HISTORICAL  CHIEF COMPLAINT:  Chief Complaint  Patient presents with   Follow-up    RM 12, alone states he is doing well, no new concerns or questions, here to discuss EEG       INTERVAL HISTORY 09/04/2021 Patient presents today for follow-up, at last visit plan was to start lamotrigine 25 mg daily and titrate up to 100 mg twice daily.  He did that titration without issues.  He reported a couple of brief moment when he had the strong smell of exhaust pipe and 2 weeks ago while returning from the [place] he got stuck in traffic was very stressed out and started having the strong smell.  The same exhaust pipe smell that patient report smelling starting from 4 PM until the next day around 11 AM.  He did tried rubbing Vicks around his nostril without help and try sleeping aid and after waking up the next morning around 11 AM the smell subsided.  Since that day, 2 weeks ago, he had not had any additional event.  I told Daniel Yoder that I am concerned that the event that he had may have been a long seizure, epilepsia partialis continua due to the fact that he was only taking 50 mg of lamotrigine twice daily.  I did informed him in the future if he had any longer event lasting more than 5 minutes to give me a call.  Again he reports for the past 2 weeks he has not had any additional event which corresponded to him taking 75 mg twice daily and 100 mg twice daily of lamotrigine.  Since last visit MRI brain was done showing no acute intracranial abnormality and his ambulatory EEG showed left temporal area of epileptogenic potential.   HISTORY OF PRESENT ILLNESS:  This is a 65 year old male with past medical history of diabetes, hypertension, hyperlipidemia, focal seizure diagnosed 20 years ago  who is presenting for new symptoms concerning for seizure.  Patient said that 20 years ago he was treated for temporal lobe epilepsy.  At that time he was having a funny sense of smell described as smelling burning metal.  He was seen by neurology had a MRI brain and EEG which was abnormal and patient was started on medication.  He reported he was trialed on 3 different antiseizure medications, does not remember their names but the last medication was able to control the seizures.  After a few years being seizure-free he discontinued medication and has not had had any seizures until about a month and a half ago.  He said about a month and a half ago he was driving on the highway and had a exhaust pipe smell he thought it was the car in front of him that was admitting that smell but since after being home this smell linger.  He mentions since then he is on average about once a week he will have the same exact exhaust pipe smell and he thought that this could have been his seizures coming back therefore he presented to his primary care doctor who referred him to neurology.  These events last a few seconds to a few minute, he cannot pinpoint a trigger.  He denies any loss of consciousness with this event, denies any convulsion.  Mentioned that his seizures are always during sensory aware  seizure.  He also reported that wife has noted that he has twitching of his right arm.     Handedness: Right   Seizure Type: Focal aware (bad smell)   Current frequency: once a week   Any injuries from seizures: None   Seizure risk factors: Head injury 23 years ago, tree branch fell on top of his head. Father with seizure  Previous ASMs: tried 3 ASM, does not remember the name   Currenty ASMs: None   ASMs side effects: Depressed   Brain Images:  Not available   Previous EEGs: Not available    OTHER MEDICAL CONDITIONS: DMII, HTN, HLD  REVIEW OF SYSTEMS: Full 14 system review of systems performed and negative  with exception of: as noted in the HPI  ALLERGIES: Allergies  Allergen Reactions   Penicillins Nausea Only    HOME MEDICATIONS: Outpatient Medications Prior to Visit  Medication Sig Dispense Refill   diclofenac (VOLTAREN) 75 MG EC tablet Take 75 mg by mouth daily.     diltiazem (CARDIZEM CD) 120 MG 24 hr capsule Take 120 mg by mouth daily.     Empagliflozin-metFORMIN HCl ER (SYNJARDY XR) 12.03-999 MG TB24 1 tablet     meloxicam (MOBIC) 7.5 MG tablet TAKE 1 TABLET BY MOUTH ONCE A DAY AS NEEDED FOR PAIN WITH FOOD     rosuvastatin (CRESTOR) 10 MG tablet Take 10 mg by mouth at bedtime.     tadalafil (CIALIS) 5 MG tablet TAKE 1 TABLET BY MOUTH ONCE DAILY AS NEEDED FOR ERECTILE DYSFUNCTION     valsartan-hydrochlorothiazide (DIOVAN-HCT) 320-25 MG tablet TAKE 1 TABLET ONCE A DAY FOR BLOOD PRESSURE  12   lamoTRIgine (LAMICTAL) 25 MG tablet Take 1 tablet (25 mg total) by mouth 2 (two) times daily. 180 tablet 0   No facility-administered medications prior to visit.    PAST MEDICAL HISTORY: Past Medical History:  Diagnosis Date   Hypertension    WPW (Wolff-Parkinson-White syndrome) 2003   Ablation 2003    PAST SURGICAL HISTORY: History reviewed. No pertinent surgical history.  FAMILY HISTORY: Family History  Problem Relation Age of Onset   Hypertension Brother    Diabetes Mellitus II Brother    Heart disease Brother        Prostate Cancer   Diabetes Mellitus II Brother    Hypertension Brother    Hypertension Mother    Heart disease Mother        CHF   Stroke Father    Hypertension Father    Diabetes Mellitus II Father     SOCIAL HISTORY: Social History   Socioeconomic History   Marital status: Married    Spouse name: Not on file   Number of children: Not on file   Years of education: Not on file   Highest education level: Not on file  Occupational History   Not on file  Tobacco Use   Smoking status: Former    Packs/day: 0.00    Years: 30.00    Pack years: 0.00     Types: Cigarettes   Smokeless tobacco: Never  Vaping Use   Vaping Use: Never used  Substance and Sexual Activity   Alcohol use: No    Alcohol/week: 0.0 standard drinks   Drug use: No   Sexual activity: Not on file  Other Topics Concern   Not on file  Social History Narrative   Not on file   Social Determinants of Health   Financial Resource Strain: Not on file  Food Insecurity:  Not on file  Transportation Needs: Not on file  Physical Activity: Not on file  Stress: Not on file  Social Connections: Not on file  Intimate Partner Violence: Not on file     PHYSICAL EXAM -GENERAL EXAM/CONSTITUTIONAL: Vitals:  Vitals:   09/04/21 1500  BP: 137/89  Pulse: 96  Weight: 210 lb (95.3 kg)  Height: 6' 1" (1.854 m)    Body mass index is 27.71 kg/m. Wt Readings from Last 3 Encounters:  09/04/21 210 lb (95.3 kg)  07/05/21 209 lb (94.8 kg)  07/16/20 193 lb (87.5 kg)   Patient is in no distress; well developed, nourished and groomed; neck is supple  EYES: Pupils round and reactive to light, Visual fields full to confrontation, Extraocular movements intacts,   MUSCULOSKELETAL: Gait, strength, tone, movements noted in Neurologic exam below  NEUROLOGIC: MENTAL STATUS:  awake, alert, oriented to person, place and time recent and remote memory intact normal attention and concentration language fluent, comprehension intact, naming intact fund of knowledge appropriate  CRANIAL NERVE:  2nd, 3rd, 4th, 6th - pupils equal and reactive to light, visual fields full to confrontation, extraocular muscles intact, no nystagmus 5th - facial sensation symmetric 7th - facial strength symmetric 8th - hearing intact 9th - palate elevates symmetrically, uvula midline 11th - shoulder shrug symmetric 12th - tongue protrusion midline  MOTOR:  normal bulk and tone, full strength in the BUE, BLE  SENSORY:  normal and symmetric to light touch, pinprick, temperature,  vibration  COORDINATION:  finger-nose-finger, fine finger movements normal  REFLEXES:  deep tendon reflexes present and symmetric  GAIT/STATION:  normal   DIAGNOSTIC DATA (LABS, IMAGING, TESTING) - I reviewed patient records, labs, notes, testing and imaging myself where available.  Lab Results  Component Value Date   WBC 9.4 07/16/2020   HGB 13.6 07/16/2020   HCT 38.9 (L) 07/16/2020   MCV 74.8 (L) 07/16/2020   PLT 329 07/16/2020      Component Value Date/Time   NA 126 (L) 07/16/2020 1435   K 5.0 07/16/2020 1435   CL 88 (L) 07/16/2020 1435   CO2 24 07/16/2020 1435   GLUCOSE 601 (HH) 07/16/2020 1435   BUN 22 07/16/2020 1435   CREATININE 1.46 (H) 07/16/2020 1435   CALCIUM 9.4 07/16/2020 1435   PROT 6.3 01/04/2009 1552   ALBUMIN 3.7 01/04/2009 1552   AST 16 01/04/2009 1552   ALT 15 01/04/2009 1552   ALKPHOS 86 01/04/2009 1552   BILITOT 0.5 01/04/2009 1552   GFRNONAA 51 (L) 07/16/2020 1435   GFRAA 59 (L) 07/16/2020 1435   Lab Results  Component Value Date   CHOL 187 11/17/2015   HDL 30 (L) 11/17/2015   LDLCALC 108 (H) 11/17/2015   TRIG 244 (H) 11/17/2015   Lab Results  Component Value Date   HGBA1C 13.7 (H) 07/16/2020   No results found for: VITAMINB12 No results found for: TSH  MRI BRAIN 07/2021 Normal MRI brain (with and without).   AMB EEG 08/2021: This is an abnormal ambulatory VEEG due to left temporal intermittent slowing and left temporal sharps. This is consistent with an area of epileptogenic potential in the left temporal lobe.  No events were recorded  I personally review both MRI and EEG.    ASSESSMENT AND PLAN  65 y.o. year old male  with past medical history of left temporal seizure, hypertension, hyperlipidemia, diabetes who is presenting for follow-up for his temporal lobe seizure.  He was started on lamotrigine 25 mg with plan  to uptitrate to 100 mg twice daily for which he achieved this week.  During this time he reported 1 event of  seizure, strong exhaust fume smell starting from 4 PM in the afternoon until 11 AM the next day consistent with epilepsia partialis continua 2 weeks ago.  At that time he was only taking 50 mg of lamotrigine twice daily.  Since that event 2 weeks ago he has not had any additional seizures.  He is currently on 100 mg lamotrigine tolerating it well, no side effect.  I will obtain a level today and continue the patient on lamotrigine 100 mg twice daily.  I will also advise him to call me if he had a seizure lasting more than 5 minutes.  I will see him in 3 months for follow-up.   1. Temporal lobe seizure ([place])   2. Epilepsia partialis continua ([place])   3. Therapeutic drug monitoring      PLAN: Continue with Lamotrigine 100 mg twice daily (new prescription given)  Will check Lamotrigine level with CMP  Return in 3 months for follow up   Per [facility] statutes, patients with seizures are not allowed to drive until they have been seizure-free for six months.  Other recommendations include using caution when using heavy equipment or power tools. Avoid working on ladders or at heights. Take showers instead of baths.  Do not swim alone.  Ensure the water temperature is not too high on the home water heater. Do not go swimming alone. Do not lock yourself in a room alone (i.e. bathroom). When caring for infants or small children, sit down when holding, feeding, or changing them to minimize risk of injury to the child in the event you have a seizure. Maintain good sleep hygiene. Avoid alcohol.  Also recommend adequate sleep, hydration, good diet and minimize stress.   During the Seizure  - First, ensure adequate ventilation and place patients on the floor on their left side  Loosen clothing around the neck and ensure the airway is patent. If the patient is clenching the teeth, do not force the mouth open with any object as this can cause severe damage - Remove all items from the surrounding that  can be hazardous. The patient may be oblivious to what's happening and may not even know what he or she is doing. If the patient is confused and wandering, either gently guide him/her away and block access to outside areas - Reassure the individual and be comforting - Call 911. In most cases, the seizure ends before EMS arrives. However, there are cases when seizures may last over 3 to 5 minutes. Or the individual may have developed breathing difficulties or severe injuries. If a pregnant patient or a person with diabetes develops a seizure, it is prudent to call an ambulance. - Finally, if the patient does not regain full consciousness, then call EMS. Most patients will remain confused for about 45 to 90 minutes after a seizure, so you must use judgment in calling for help. - Avoid restraints but make sure the patient is in a bed with padded side rails - Place the individual in a lateral position with the neck slightly flexed; this will help the saliva drain from the mouth and prevent the tongue from falling backward - Remove all nearby furniture and other hazards from the area - Provide verbal assurance as the individual is regaining consciousness - Provide the patient with privacy if possible - Call for help and start treatment  as ordered by the caregiver   After the Seizure (Postictal Stage)  After a seizure, most patients experience confusion, fatigue, muscle pain and/or a headache. Thus, one should permit the individual to sleep. For the next few days, reassurance is essential. Being calm and helping reorient the person is also of importance.  Most seizures are painless and end spontaneously. Seizures are not harmful to others but can lead to complications such as stress on the lungs, brain and the heart. Individuals with prior lung problems may develop labored breathing and respiratory distress.     Orders Placed This Encounter  Procedures   Lamotrigine level   CMP     Meds ordered  this encounter  Medications   lamoTRIgine (LAMICTAL) 100 MG tablet    Sig: Take 1 tablet (100 mg total) by mouth 2 (two) times daily.    Dispense:  180 tablet    Refill:  4     Return in about 3 months (around 12/05/2021).    Alric Ran, MD 09/04/2021, 4:56 PM  [facility] [address] [place], [place] [address] [phone]

## 2021-09-04 NOTE — Patient Instructions (Signed)
Continue with Lamotrigine 100 mg twice daily  Will check Lamotrigine level with CMP  Return in 3 months for follow up

## 2021-09-06 LAB — COMPREHENSIVE METABOLIC PANEL
ALT: 20 IU/L (ref 0–44)
AST: 17 IU/L (ref 0–40)
Albumin/Globulin Ratio: 1.8 (ref 1.2–2.2)
Albumin: 4.7 g/dL (ref 3.8–4.8)
Alkaline Phosphatase: 103 IU/L (ref 44–121)
BUN/Creatinine Ratio: 12 (ref 10–24)
BUN: 17 mg/dL (ref 8–27)
Bilirubin Total: <0.2 mg/dL (ref 0.0–1.2)
CO2: 22 mmol/L (ref 20–29)
Calcium: 10.2 mg/dL (ref 8.6–10.2)
Chloride: 104 mmol/L (ref 96–106)
Creatinine, Ser: 1.4 mg/dL — ABNORMAL HIGH (ref 0.76–1.27)
Globulin, Total: 2.6 g/dL (ref 1.5–4.5)
Glucose: 113 mg/dL — ABNORMAL HIGH (ref 70–99)
Potassium: 4.4 mmol/L (ref 3.5–5.2)
Sodium: 142 mmol/L (ref 134–144)
Total Protein: 7.3 g/dL (ref 6.0–8.5)
eGFR: 56 mL/min/{1.73_m2} — ABNORMAL LOW (ref >59)

## 2021-09-06 LAB — LAMOTRIGINE LEVEL: Lamotrigine Lvl: <1 ug/mL — ABNORMAL LOW (ref 2.0–20.0)

## 2021-09-09 ENCOUNTER — Telehealth: Payer: Self-pay | Admitting: Neurology

## 2021-09-09 MED ORDER — OXCARBAZEPINE 300 MG PO TABS
300.0000 mg | ORAL_TABLET | Freq: Two times a day (BID) | ORAL | 0 refills | Status: DC
Start: 2021-09-09 — End: 2021-10-08

## 2021-09-09 NOTE — Telephone Encounter (Signed)
Pt called, having a side effect to lamoTRIgine (LAMICTAL) 100 MG tablet . Headache started 2 weeks ago. Did a test did not take medication yesterday morning and did not have a headache. Took this morning and by 12 pm headache was back. Would like a call from the nurse.

## 2021-09-09 NOTE — Telephone Encounter (Signed)
Unable to tolerate Lamotrigine (headaches), will switch him to Trileptal.   Alric Ran, MD

## 2021-09-09 NOTE — Telephone Encounter (Signed)
I called the patient back. He was having intermittent headaches w/ lamotrigine 50mg  BID but did not attribute them to the medication. Headaches have worsened since the increase to 100mg  BID. He is keeping a constant headache now, sometime w/ pain at 4-5. He is asking if a medication change should be made since it has been two months now.

## 2021-09-09 NOTE — Telephone Encounter (Signed)
Spoke with patient, reports a side effect of Lamotrigine (Headaches) since starting the medication. Will switch him to Trileptal.  New prescription will be sent to the Pharmacy   Daniel Ran, MD

## 2021-09-18 ENCOUNTER — Telehealth: Payer: Self-pay | Admitting: Neurology

## 2021-09-18 NOTE — Telephone Encounter (Signed)
Pt called states he has had a seizure for the past 2 hours now. He expresses that he smells exhaust fumes. Pt requesting a call back.

## 2021-09-18 NOTE — Telephone Encounter (Signed)
Spoke with patient, stated he has been having ongoing focal seizures, still having the smell of exhaust pipe since noon today.  I advised him to take additional dose of Trileptal.  He informed me that he is at work but as soon as he gets home he will take additional dose.  Currently he is taking Trileptal 300 mg twice daily, I advised him to increase the dose to 600 mg twice daily and to contact me once he ran out of medication for a new prescription.  Alric Ran, MD

## 2021-10-07 ENCOUNTER — Other Ambulatory Visit: Payer: Self-pay | Admitting: Neurology

## 2021-10-11 ENCOUNTER — Telehealth: Payer: Self-pay | Admitting: Radiation Oncology

## 2021-10-16 NOTE — Progress Notes (Signed)
[facility] Location of Tumor / Histology: Prostate Ca  If Prostate Cancer, Gleason Score is (4 + 3) and PSA is (5.23 as of 09/2021)   Biopsies  Dr. Junious Silk         Past/Anticipated interventions by urology, if any:   Past/Anticipated interventions by medical oncology, if any:   Weight changes, if any: No  IPSS:  21 SHIM:  22  Bowel/Bladder complaints, if any:  No bowel or bladder issues.  Nausea/Vomiting, if any: No  Pain issues, if any:  0/10 SAFETY ISSUES: Prior radiation?  No Pacemaker/ICD? No Possible current pregnancy?  Male Is the patient on methotrexate?  No  Current Complaints / other details:  Need more information on treatment options.

## 2021-10-18 ENCOUNTER — Other Ambulatory Visit: Payer: Self-pay

## 2021-10-18 ENCOUNTER — Ambulatory Visit
Admission: RE | Admit: 2021-10-18 | Discharge: 2021-10-18 | Disposition: A | Discharge status: Home / Self Care | Payer: 59 | Source: Ambulatory Visit | Attending: Radiation Oncology | Admitting: Radiation Oncology

## 2021-10-18 ENCOUNTER — Encounter: Payer: Self-pay | Admitting: Radiation Oncology

## 2021-10-18 VITALS — BP 151/104 | HR 73 | Temp 96.1°F | Resp 18 | Ht 73.0 in | Wt 213.4 lb

## 2021-10-18 DIAGNOSIS — Z8042 Family history of malignant neoplasm of prostate: Secondary | ICD-10-CM | POA: Diagnosis not present

## 2021-10-18 DIAGNOSIS — I456 Pre-excitation syndrome: Secondary | ICD-10-CM | POA: Insufficient documentation

## 2021-10-18 DIAGNOSIS — I10 Essential (primary) hypertension: Secondary | ICD-10-CM | POA: Insufficient documentation

## 2021-10-18 DIAGNOSIS — Z79899 Other long term (current) drug therapy: Secondary | ICD-10-CM | POA: Diagnosis not present

## 2021-10-18 DIAGNOSIS — C61 Malignant neoplasm of prostate: Secondary | ICD-10-CM

## 2021-10-18 NOTE — Progress Notes (Signed)
Radiation Oncology         [phone] ________________________________  Initial Outpatient Consultation  Name: Daniel Yoder MRN: [id number]  Date: 10/18/2021  DOB: 1956/01/19  VO:[id number], Herbie [place], MD  Festus Aloe, MD   REFERRING PHYSICIAN: Festus Aloe, MD  DIAGNOSIS: 65 y.o. gentleman with Stage T1c adenocarcinoma of the prostate with Gleason score of 3+4, and PSA of 5.23.    ICD-10-CM   1. Malignant neoplasm of prostate ([place])  C61       HISTORY OF PRESENT ILLNESS: Daniel Yoder is a 65 y.o. male with a diagnosis of prostate cancer. He has a history of elevated PSA at 4.6 in February 2021 but on follow-up with repeat PSA in April 2021 the PSA had decreased back down to normal at 3.9.  However, the PSA increased to 5.22 in June 2022 and remained elevated at 5.15 in July 2022.  Accordingly, he was referred for evaluation in urology by Dr. Junious Silk on 07/18/21,  digital rectal examination was performed at that time revealing no nodules. Repeat PSA that day showed persistent elevation at 5.23. The patient proceeded to transrectal ultrasound with 12 biopsies of the prostate on 09/20/21.  The prostate volume measured 19.3 cc.  Out of 12 core biopsies, 8 were positive.  The maximum Gleason score was 3+4, and this was seen in the left apex lateral (with perineural invasion), left base, left mid, right base lateral (with PNI), and right apex lateral. Additionally, Gleason 3+3 was seen in the right mid lateral, left mid lateral, and left base lateral (small focus).  The patient reviewed the biopsy results with his urologist and he has kindly been referred today for discussion of potential radiation treatment options.   PREVIOUS RADIATION THERAPY: No  PAST MEDICAL HISTORY:  Past Medical History:  Diagnosis Date   Hypertension    WPW (Wolff-Parkinson-White syndrome) 2003   Ablation 2003      PAST SURGICAL HISTORY:History reviewed. No pertinent surgical history.  FAMILY  HISTORY:  Family History  Problem Relation Age of Onset   Hypertension Brother    Diabetes Mellitus II Brother    Heart disease Brother        Prostate Cancer   Diabetes Mellitus II Brother    Hypertension Brother    Hypertension Mother    Heart disease Mother        CHF   Stroke Father    Hypertension Father    Diabetes Mellitus II Father     SOCIAL HISTORY:  Social History   Socioeconomic History   Marital status: Married    Spouse name: Not on file   Number of children: Not on file   Years of education: Not on file   Highest education level: Not on file  Occupational History   Not on file  Tobacco Use   Smoking status: Former    Packs/day: 0.00    Years: 30.00    Pack years: 0.00    Types: Cigarettes   Smokeless tobacco: Never  Vaping Use   Vaping Use: Never used  Substance and Sexual Activity   Alcohol use: No    Alcohol/week: 0.0 standard drinks   Drug use: No   Sexual activity: Not on file  Other Topics Concern   Not on file  Social History Narrative   Not on file   Social Determinants of Health   Financial Resource Strain: Not on file  Food Insecurity: Not on file  Transportation Needs: Not on file  Physical Activity: Not  on file  Stress: Not on file  Social Connections: Not on file  Intimate Partner Violence: Not on file    ALLERGIES: Amoxicillin, Penicillins, and Septra [sulfamethoxazole-trimethoprim]  MEDICATIONS:  Current Outpatient Medications  Medication Sig Dispense Refill   Blood Glucose Monitoring Suppl (ONETOUCH VERIO) w/Device KIT See admin instructions.     carvedilol (COREG) 25 MG tablet 1 tablet     diclofenac (VOLTAREN) 75 MG EC tablet Take 75 mg by mouth daily.     diltiazem (CARDIZEM CD) 120 MG 24 hr capsule Take 120 mg by mouth daily.     Empagliflozin-metFORMIN HCl ER (SYNJARDY XR) 12.03-999 MG TB24 1 tablet     Oxcarbazepine (TRILEPTAL) 300 MG tablet TAKE 1 TABLET BY MOUTH 2 TIMES DAILY. 60 tablet 0   rosuvastatin  (CRESTOR) 10 MG tablet Take 10 mg by mouth at bedtime.     meloxicam (MOBIC) 7.5 MG tablet TAKE 1 TABLET BY MOUTH ONCE A DAY AS NEEDED FOR PAIN WITH FOOD (Patient not taking: Reported on 10/18/2021)     tadalafil (CIALIS) 5 MG tablet TAKE 1 TABLET BY MOUTH ONCE DAILY AS NEEDED FOR ERECTILE DYSFUNCTION (Patient not taking: Reported on 10/18/2021)     valsartan-hydrochlorothiazide (DIOVAN-HCT) 320-25 MG tablet TAKE 1 TABLET ONCE A DAY FOR BLOOD PRESSURE (Patient not taking: Reported on 10/18/2021)  12   No current facility-administered medications for this encounter.    REVIEW OF SYSTEMS:  On review of systems, the patient reports that he is doing well overall. He denies any chest pain, shortness of breath, cough, fevers, chills, night sweats, unintended weight changes. He denies any bowel disturbances, and denies abdominal pain, nausea or vomiting. He denies any new musculoskeletal or joint aches or pains. His IPSS was 21, indicating moderate-severe urinary symptoms with mostly irritative voiding symptoms. His SHIM was 22, indicating he has some mild erectile dysfunction. A complete review of systems is obtained and is otherwise negative.    PHYSICAL EXAM:  Wt Readings from Last 3 Encounters:  10/18/21 213 lb 6 oz (96.8 kg)  09/04/21 210 lb (95.3 kg)  07/05/21 209 lb (94.8 kg)   Temp Readings from Last 3 Encounters:  10/18/21 (!) 96.1 F (35.6 C) (Temporal)  07/16/20 98.4 F (36.9 C) (Oral)  11/18/15 97.6 F (36.4 C) (Oral)   BP Readings from Last 3 Encounters:  10/18/21 (!) 151/104  09/04/21 137/89  07/05/21 (!) 152/89   Pulse Readings from Last 3 Encounters:  10/18/21 73  09/04/21 96  07/05/21 89   Pain Assessment Pain Score: 0-No pain/10  In general this is a well appearing African-American male in no acute distress. He's alert and oriented x4 and appropriate throughout the examination. Cardiopulmonary assessment is negative for acute distress, and he exhibits normal effort.      KPS = 100  100 - Normal; no complaints; no evidence of disease. 90   - Able to carry on normal activity; minor signs or symptoms of disease. 80   - Normal activity with effort; some signs or symptoms of disease. 81   - Cares for self; unable to carry on normal activity or to do active work. 60   - Requires occasional assistance, but is able to care for most of his personal needs. 50   - Requires considerable assistance and frequent medical care. 75   - Disabled; requires special care and assistance. 18   - Severely disabled; hospital admission is indicated although death not imminent. 15   - Very sick; hospital admission  necessary; active supportive treatment necessary. 10   - Moribund; fatal processes progressing rapidly. 0     - Dead  Karnofsky DA, Abelmann [place], Craver LS and Burchenal [facility] [phone]) The use of the nitrogen mustards in the palliative treatment of carcinoma: with particular reference to bronchogenic carcinoma Cancer 1 634-56  LABORATORY DATA:  Lab Results  Component Value Date   WBC 9.4 07/16/2020   HGB 13.6 07/16/2020   HCT 38.9 (L) 07/16/2020   MCV 74.8 (L) 07/16/2020   PLT 329 07/16/2020   Lab Results  Component Value Date   NA 142 09/04/2021   K 4.4 09/04/2021   CL 104 09/04/2021   CO2 22 09/04/2021   Lab Results  Component Value Date   ALT 20 09/04/2021   AST 17 09/04/2021   ALKPHOS 103 09/04/2021   BILITOT <0.2 09/04/2021     RADIOGRAPHY: No results found.    IMPRESSION/PLAN: 1. 65 y.o. gentleman with Stage T1c adenocarcinoma of the prostate with Gleason Score of 3+4, and PSA of 5.23. We discussed the patient's workup and outlined the nature of prostate cancer in this setting. The patient's T stage, Gleason's score, and PSA put him into the favorable intermediate risk group. Accordingly, he is eligible for a variety of potential treatment options including brachytherapy, 5.5 weeks of external radiation, or prostatectomy. We discussed the available  radiation techniques, and focused on the details and logistics of delivery. The patient may not be an ideal candidate for brachytherapy given his moderate-severe LUTS but in our discussion, he reports that these are mostly irritative and not obstructive symptoms, likely overactive bladder. He has a small prostate so we feel that he would be able to tolerate brachytherapy. We discussed and outlined the risks, benefits, short and long-term effects associated with radiotherapy and compared and contrasted these with prostatectomy. We discussed the role of SpaceOAR gel in reducing the rectal toxicity associated with radiotherapy.  He appears to have a good understanding of his disease and our treatment recommendations which are of curative intent.  He was encouraged to ask questions that were answered to his stated satisfaction.  At the conclusion of our conversation, the patient is interested in moving forward with brachytherapy and use of SpaceOAR gel to reduce rectal toxicity from radiotherapy.  We will share our discussion with Dr. Junious Silk and move forward with scheduling his [facility] planning appointment in the near future.  He would like to discuss trying medications for OAB prior to his procedure to see if his urinary symptoms improve on these medications.  The patient met briefly with Romie Jumper in our office who will be working closely with him to coordinate OR scheduling and pre and post procedure appointments.  We will contact the pharmaceutical rep to ensure that [employer] is available at the time of procedure.  We enjoyed meeting him today and look forward to continuing to participate in his care.   We personally spent 70 minutes in this encounter including chart review, reviewing radiological studies, meeting face-to-face with the patient, entering orders and completing documentation.    Nicholos Johns, PA-C    Tyler Pita, MD  [facility] Direct Dial:  [phone]   Fax: [phone] [email or web address]   Skype   LinkedIn   This document serves as a record of services personally performed by Tyler Pita, MD and Freeman Caldron, PA-C. It was created on their behalf by Wilburn Mylar, a trained medical scribe. The creation of this record is based  on the scribe's personal observations and the provider's statements to them. This document has been checked and approved by the attending provider.

## 2021-10-18 NOTE — Progress Notes (Signed)
Introduced myself to the patient as the prostate nurse navigator.  No barriers to care identified at this time.  He is here to discuss his radiation treatment options.  I gave him my business card and asked him to call me with questions or concerns.  Verbalized understanding.  ?

## 2021-10-24 ENCOUNTER — Telehealth: Payer: Self-pay | Admitting: *Deleted

## 2021-10-24 NOTE — Telephone Encounter (Signed)
CALLED PATIENT TO UPDATE, SPOKE WITH PATIENT 

## 2021-11-13 ENCOUNTER — Other Ambulatory Visit: Payer: Self-pay | Admitting: Urology

## 2021-11-14 ENCOUNTER — Telehealth: Payer: Self-pay | Admitting: *Deleted

## 2021-11-14 NOTE — Telephone Encounter (Signed)
Called patient to inform of implant date, spoke with patient and he is aware of this procedure

## 2021-11-14 NOTE — Progress Notes (Signed)
Pre seed eval done needs ekg appt made for ekg pre op

## 2021-11-27 ENCOUNTER — Telehealth: Payer: Self-pay | Admitting: *Deleted

## 2021-11-27 NOTE — Telephone Encounter (Signed)
CALLED PATIENT TO REMIND OF PRE-SEED APPTS. FOR 11-28-21, SPOKE WITH PATIENT AND HE IS AWARE OF THESE APPTS.

## 2021-11-28 ENCOUNTER — Ambulatory Visit
Admission: RE | Admit: 2021-11-28 | Discharge: 2021-11-28 | Disposition: A | Discharge status: Home / Self Care | Payer: 59 | Source: Ambulatory Visit | Attending: Urology | Admitting: Urology

## 2021-11-28 ENCOUNTER — Encounter (HOSPITAL_COMMUNITY)
Admission: RE | Admit: 2021-11-28 | Discharge: 2021-11-28 | Disposition: A | Discharge status: Home / Self Care | Payer: 59 | Source: Ambulatory Visit | Attending: Urology | Admitting: Urology

## 2021-11-28 ENCOUNTER — Other Ambulatory Visit: Payer: Self-pay

## 2021-11-28 ENCOUNTER — Ambulatory Visit
Admission: RE | Admit: 2021-11-28 | Discharge: 2021-11-28 | Disposition: A | Discharge status: Home / Self Care | Payer: 59 | Source: Ambulatory Visit | Attending: Radiation Oncology | Admitting: Radiation Oncology

## 2021-11-28 ENCOUNTER — Encounter: Payer: Self-pay | Admitting: Urology

## 2021-11-28 VITALS — Resp 19 | Ht 73.0 in | Wt 231.2 lb

## 2021-11-28 DIAGNOSIS — C61 Malignant neoplasm of prostate: Secondary | ICD-10-CM | POA: Insufficient documentation

## 2021-11-28 DIAGNOSIS — Z01810 Encounter for preprocedural cardiovascular examination: Secondary | ICD-10-CM | POA: Insufficient documentation

## 2021-11-28 NOTE — Progress Notes (Signed)
Spoke w/ patient, verified identity, and begin nursing interview. Patient states "Doing well." No symptoms reported at this time.  Meaningful use complete. No current urinary management medications. Urology appointment- March, 2023- per patient  Resp 19    Ht 6\' 1"  (1.854 m)    Wt 231 lb 3.2 oz (104.9 kg)    BMI 30.50 kg/m

## 2021-11-28 NOTE — Progress Notes (Signed)
°  Radiation Oncology         [id number] ________________________________  Name: Daniel Yoder MRN: [id number]  Date: 11/28/2021  DOB: Aug 22, 1956  SIMULATION AND TREATMENT PLANNING NOTE PUBIC ARCH STUDY  [id number], Herbie [place], MD  Festus Aloe, MD  DIAGNOSIS: 66 y.o. gentleman with Stage T1c adenocarcinoma of the prostate with Gleason score of 3+4, and PSA of 5.23.  Oncology History  Malignant neoplasm of prostate ([place])  09/20/2021 Cancer Staging   Staging form: Prostate, AJCC 8th Edition - Clinical stage from 09/20/2021: Stage IIB (cT1c, cN0, cM0, PSA: 5.2, Grade Group: 2) - Signed by Freeman Caldron, PA-C on 10/18/2021 Histopathologic type: Adenocarcinoma, NOS Stage prefix: Initial diagnosis Prostate specific antigen (PSA) range: Less than 10 Gleason primary pattern: 3 Gleason secondary pattern: 4 Gleason score: 7 Histologic grading system: 5 grade system Number of biopsy cores examined: 12 Number of biopsy cores positive: 8 Location of positive needle core biopsies: Both sides    10/18/2021 Initial Diagnosis   Malignant neoplasm of prostate ([place])       ICD-10-CM   1. Malignant neoplasm of prostate ([place])  C61       COMPLEX SIMULATION:  The patient presented today for evaluation for possible prostate seed implant. He was brought to the radiation planning suite and placed supine on the CT couch. A 3-dimensional image study set was obtained in upload to the planning computer. There, on each axial slice, I contoured the prostate gland. Then, using three-dimensional radiation planning tools I reconstructed the prostate in view of the structures from the transperineal needle pathway to assess for possible pubic arch interference. In doing so, I did not appreciate any pubic arch interference. Also, the patient's prostate volume was estimated based on the drawn structure. The volume was 20 cc.  Given the pubic arch appearance and prostate volume, patient remains a good candidate  to proceed with prostate seed implant. Today, he freely provided informed written consent to proceed.    PLAN: The patient will undergo prostate seed implant.   ________________________________  Sheral Apley. Tammi Klippel, M.D.

## 2021-12-05 ENCOUNTER — Ambulatory Visit: Payer: 59 | Admitting: Neurology

## 2021-12-10 ENCOUNTER — Encounter: Payer: Self-pay | Admitting: Neurology

## 2021-12-10 ENCOUNTER — Ambulatory Visit: Payer: 59 | Admitting: Neurology

## 2021-12-10 VITALS — BP 144/95 | HR 69 | Ht 73.0 in | Wt 212.5 lb

## 2021-12-10 DIAGNOSIS — J309 Allergic rhinitis, unspecified: Secondary | ICD-10-CM | POA: Insufficient documentation

## 2021-12-10 DIAGNOSIS — G40109 Localization-related (focal) (partial) symptomatic epilepsy and epileptic syndromes with simple partial seizures, not intractable, without status epilepticus: Secondary | ICD-10-CM

## 2021-12-10 DIAGNOSIS — R899 Unspecified abnormal finding in specimens from other organs, systems and tissues: Secondary | ICD-10-CM | POA: Insufficient documentation

## 2021-12-10 DIAGNOSIS — D229 Melanocytic nevi, unspecified: Secondary | ICD-10-CM | POA: Insufficient documentation

## 2021-12-10 DIAGNOSIS — Z5181 Encounter for therapeutic drug level monitoring: Secondary | ICD-10-CM | POA: Diagnosis not present

## 2021-12-10 DIAGNOSIS — Z8546 Personal history of malignant neoplasm of prostate: Secondary | ICD-10-CM | POA: Insufficient documentation

## 2021-12-10 DIAGNOSIS — J329 Chronic sinusitis, unspecified: Secondary | ICD-10-CM | POA: Insufficient documentation

## 2021-12-10 DIAGNOSIS — B9689 Other specified bacterial agents as the cause of diseases classified elsewhere: Secondary | ICD-10-CM | POA: Insufficient documentation

## 2021-12-10 DIAGNOSIS — G40909 Epilepsy, unspecified, not intractable, without status epilepticus: Secondary | ICD-10-CM | POA: Insufficient documentation

## 2021-12-10 DIAGNOSIS — C61 Malignant neoplasm of prostate: Secondary | ICD-10-CM | POA: Diagnosis not present

## 2021-12-10 DIAGNOSIS — I10 Essential (primary) hypertension: Secondary | ICD-10-CM

## 2021-12-10 DIAGNOSIS — N529 Male erectile dysfunction, unspecified: Secondary | ICD-10-CM | POA: Insufficient documentation

## 2021-12-10 DIAGNOSIS — Z794 Long term (current) use of insulin: Secondary | ICD-10-CM | POA: Insufficient documentation

## 2021-12-10 DIAGNOSIS — L918 Other hypertrophic disorders of the skin: Secondary | ICD-10-CM | POA: Insufficient documentation

## 2021-12-10 DIAGNOSIS — M1711 Unilateral primary osteoarthritis, right knee: Secondary | ICD-10-CM | POA: Insufficient documentation

## 2021-12-10 DIAGNOSIS — E1165 Type 2 diabetes mellitus with hyperglycemia: Secondary | ICD-10-CM | POA: Insufficient documentation

## 2021-12-10 DIAGNOSIS — E7800 Pure hypercholesterolemia, unspecified: Secondary | ICD-10-CM | POA: Insufficient documentation

## 2021-12-10 MED ORDER — OXCARBAZEPINE 300 MG PO TABS: 300.0000 mg | ORAL_TABLET | Freq: Two times a day (BID) | ORAL | 11 refills | Status: DC

## 2021-12-10 NOTE — Patient Instructions (Addendum)
Continue oxcarbazepine 300 mg twice daily, will check level today.  Refill given. We will also check CMP Return in 1 year or sooner if worse.

## 2021-12-10 NOTE — Progress Notes (Signed)
[facility]  PATIENT: Daniel Yoder DOB: 26-Aug-1956  REFERRING CLINICIAN: Gaynelle Arabian, MD HISTORY FROM: Patient  REASON FOR VISIT: Seizure/Temporal lobe epilepsy.    HISTORICAL  CHIEF COMPLAINT:  Chief Complaint  Patient presents with   Follow-up    Rm 12. Alone. Denies any new seizure like activity. Continues oxcarbazepine one tablet two times daily, tolerating well.   INTERVAL HISTORY 12/10/2021:  Daniel Yoder presents today for follow-up, last visit in November, at that time plan was to continue lamotrigine for his temporal lobe epilepsy.  Since then he has contacted me complaining of headaches after taking lamotrigine, because headache is a known side effect of lamotrigine, I switched him to oxcarbazepine.  He is doing well on oxcarbazepine 300 mg twice daily, denies any additional seizures or seizure-like event.  He is satisfied with his current regimen.  He reported that recently he was diagnosed with prostate cancer, and is set up to have treatment in March, he is in good mood. All issues discussed today is his elevated blood pressure, he did follow with his primary care doctor who asked him to double his antihypertensive medications.    INTERVAL HISTORY 09/04/2021 Patient presents today for follow-up, at last visit plan was to start lamotrigine 25 mg daily and titrate up to 100 mg twice daily.  He did that titration without issues.  He reported a couple of brief moment when he had the strong smell of exhaust pipe and 2 weeks ago while returning from the [place] he got stuck in traffic was very stressed out and started having the strong smell.  The same exhaust pipe smell that patient report smelling starting from 4 PM until the next day around 11 AM.  He did tried rubbing Vicks around his nostril without help and try sleeping aid and after waking up the next morning around 11 AM the smell subsided.  Since that day, 2 weeks ago, he had not had any  additional event.  I told [name] that I am concerned that the event that he had may have been a long seizure, epilepsia partialis continua due to the fact that he was only taking 50 mg of lamotrigine twice daily.  I did informed him in the future if he had any longer event lasting more than 5 minutes to give me a call.  Again he reports for the past 2 weeks he has not had any additional event which corresponded to him taking 75 mg twice daily and 100 mg twice daily of lamotrigine.  Since last visit MRI brain was done showing no acute intracranial abnormality and his ambulatory EEG showed left temporal area of epileptogenic potential.   HISTORY OF PRESENT ILLNESS:  This is a 66 year old male with past medical history of diabetes, hypertension, hyperlipidemia, focal seizure diagnosed 20 years ago who is presenting for new symptoms concerning for seizure.  Patient said that 20 years ago he was treated for temporal lobe epilepsy.  At that time he was having a funny sense of smell described as smelling burning metal.  He was seen by neurology had a MRI brain and EEG which was abnormal and patient was started on medication.  He reported he was trialed on 3 different antiseizure medications, does not remember their names but the last medication was able to control the seizures.  After a few years being seizure-free he discontinued medication and has not had had any seizures until about a month and a half ago.  He  said about a month and a half ago he was driving on the highway and had a exhaust pipe smell he thought it was the car in front of him that was admitting that smell but since after being home this smell linger.  He mentions since then he is on average about once a week he will have the same exact exhaust pipe smell and he thought that this could have been his seizures coming back therefore he presented to his primary care doctor who referred him to neurology.  These events last a few seconds to a few  minute, he cannot pinpoint a trigger.  He denies any loss of consciousness with this event, denies any convulsion.  Mentioned that his seizures are always during sensory aware seizure.  He also reported that wife has noted that he has twitching of his right arm.     Handedness: Right   Seizure Type: Focal aware (bad smell)   Current frequency: once a week   Any injuries from seizures: None   Seizure risk factors: Head injury 23 years ago, tree branch fell on top of his head. Father with seizure  Previous ASMs: tried 3 ASM, does not remember the name   Currenty ASMs: None   ASMs side effects: Depressed   Brain Images:  Not available   Previous EEGs: Not available    OTHER MEDICAL CONDITIONS: DMII, HTN, HLD  REVIEW OF SYSTEMS: Full 14 system review of systems performed and negative with exception of: as noted in the HPI  ALLERGIES: Allergies  Allergen Reactions   Amoxicillin     Other reaction(s): severe nausea   Penicillins Nausea Only   Septra [Sulfamethoxazole-Trimethoprim]     Other reaction(s): GI Upset (05/2018)    HOME MEDICATIONS: Outpatient Medications Prior to Visit  Medication Sig Dispense Refill   Blood Glucose Monitoring Suppl (ONETOUCH VERIO) w/Device KIT See admin instructions.     carvedilol (COREG) 25 MG tablet 1 tablet     diclofenac (VOLTAREN) 75 MG EC tablet Take 75 mg by mouth daily.     diltiazem (CARDIZEM CD) 120 MG 24 hr capsule Take 120 mg by mouth daily.     Empagliflozin-metFORMIN HCl ER (SYNJARDY XR) 12.03-999 MG TB24 1 tablet     meloxicam (MOBIC) 7.5 MG tablet      rosuvastatin (CRESTOR) 10 MG tablet Take 10 mg by mouth at bedtime.     solifenacin (VESICARE) 5 MG tablet Take 5 mg by mouth daily.     tadalafil (CIALIS) 5 MG tablet      valsartan-hydrochlorothiazide (DIOVAN-HCT) 320-25 MG tablet   12   Oxcarbazepine (TRILEPTAL) 300 MG tablet TAKE 1 TABLET BY MOUTH 2 TIMES DAILY. 60 tablet 0   No facility-administered medications prior to  visit.    PAST MEDICAL HISTORY: Past Medical History:  Diagnosis Date   Hypertension    WPW (Wolff-Parkinson-White syndrome) 2003   Ablation 2003    PAST SURGICAL HISTORY: History reviewed. No pertinent surgical history.  FAMILY HISTORY: Family History  Problem Relation Age of Onset   Hypertension Brother    Diabetes Mellitus II Brother    Heart disease Brother        Prostate Cancer   Diabetes Mellitus II Brother    Hypertension Brother    Hypertension Mother    Heart disease Mother        CHF   Stroke Father    Hypertension Father    Diabetes Mellitus II Father     SOCIAL HISTORY:  Social History   Socioeconomic History   Marital status: Married    Spouse name: Not on file   Number of children: Not on file   Years of education: Not on file   Highest education level: Not on file  Occupational History   Not on file  Tobacco Use   Smoking status: Former    Packs/day: 0.00    Years: 30.00    Pack years: 0.00    Types: Cigarettes   Smokeless tobacco: Never  Vaping Use   Vaping Use: Never used  Substance and Sexual Activity   Alcohol use: No    Alcohol/week: 0.0 standard drinks   Drug use: No   Sexual activity: Not on file  Other Topics Concern   Not on file  Social History Narrative   Not on file   Social Determinants of Health   Financial Resource Strain: Not on file  Food Insecurity: Not on file  Transportation Needs: Not on file  Physical Activity: Not on file  Stress: Not on file  Social Connections: Not on file  Intimate Partner Violence: Not on file     PHYSICAL EXAM -GENERAL EXAM/CONSTITUTIONAL: Vitals:  Vitals:   12/10/21 1109  BP: (!) 144/95  Pulse: 69  Weight: 212 lb 8 oz (96.4 kg)  Height: _0  (1.854 m)    Body mass index is 28.04 kg/m. Wt Readings from Last 3 Encounters:  12/10/21 212 lb 8 oz (96.4 kg)  11/28/21 231 lb 3.2 oz (104.9 kg)  10/18/21 213 lb 6 oz (96.8 kg)   Patient is in no distress; well developed,  nourished and groomed; neck is supple  EYES: Pupils round and reactive to light, Visual fields full to confrontation, Extraocular movements intacts,   MUSCULOSKELETAL: Gait, strength, tone, movements noted in Neurologic exam below  NEUROLOGIC: MENTAL STATUS:  awake, alert, oriented to person, place and time recent and remote memory intact normal attention and concentration language fluent, comprehension intact, naming intact fund of knowledge appropriate  CRANIAL NERVE:  2nd, 3rd, 4th, 6th - pupils equal and reactive to light, visual fields full to confrontation, extraocular muscles intact, no nystagmus 5th - facial sensation symmetric 7th - facial strength symmetric 8th - hearing intact 9th - palate elevates symmetrically, uvula midline 11th - shoulder shrug symmetric 12th - tongue protrusion midline  MOTOR:  normal bulk and tone, full strength in the BUE, BLE  SENSORY:  normal and symmetric to light touch, pinprick, temperature, vibration  COORDINATION:  finger-nose-finger, fine finger movements normal  REFLEXES:  deep tendon reflexes present and symmetric  GAIT/STATION:  normal   DIAGNOSTIC DATA (LABS, IMAGING, TESTING) - I reviewed patient records, labs, notes, testing and imaging myself where available.  Lab Results  Component Value Date   WBC 9.4 07/16/2020   HGB 13.6 07/16/2020   HCT 38.9 (L) 07/16/2020   MCV 74.8 (L) 07/16/2020   PLT 329 07/16/2020      Component Value Date/Time   NA 142 09/04/2021 1541   K 4.4 09/04/2021 1541   CL 104 09/04/2021 1541   CO2 22 09/04/2021 1541   GLUCOSE 113 (H) 09/04/2021 1541   GLUCOSE 601 (HH) 07/16/2020 1435   BUN 17 09/04/2021 1541   CREATININE 1.40 (H) 09/04/2021 1541   CALCIUM 10.2 09/04/2021 1541   PROT 7.3 09/04/2021 1541   ALBUMIN 4.7 09/04/2021 1541   AST 17 09/04/2021 1541   ALT 20 09/04/2021 1541   ALKPHOS 103 09/04/2021 1541   BILITOT <0.2 09/04/2021 1541  GFRNONAA 51 (L) 07/16/2020 1435    GFRAA 59 (L) 07/16/2020 1435   Lab Results  Component Value Date   CHOL 187 11/17/2015   HDL 30 (L) 11/17/2015   LDLCALC 108 (H) 11/17/2015   TRIG 244 (H) 11/17/2015   Lab Results  Component Value Date   HGBA1C 13.7 (H) 07/16/2020   No results found for: VITAMINB12 No results found for: TSH  MRI BRAIN 07/2021 Normal MRI brain (with and without).   AMB EEG 08/2021: This is an abnormal ambulatory VEEG due to left temporal intermittent slowing and left temporal sharps. This is consistent with an area of epileptogenic potential in the left temporal lobe.  No events were recorded  I personally review both MRI and EEG.    ASSESSMENT AND PLAN  66 y.o. year old male  with past medical history of left temporal seizure, hypertension, hyperlipidemia, diabetes who is presenting for follow-up for his temporal lobe seizure.  He was started on lamotrigine 25 mg with plan to uptitrate to 100 mg twice daily for which he achieved this week.  During this time he reported 1 event of seizure, strong exhaust fume smell starting from 4 PM in the afternoon until 11 AM the next day consistent with epilepsia partialis continua 2 weeks ago.  At that time he was only taking 50 mg of lamotrigine twice daily.  Since that event 2 weeks ago he has not had any additional seizures.  He is currently on 100 mg lamotrigine tolerating it well, no side effect.  I will obtain a level today and continue the patient on lamotrigine 100 mg twice daily.  I will also advise him to call me if he had a seizure lasting more than 5 minutes.  I will see him in 3 months for follow-up.   1. Temporal lobe seizure ([place])   2. Hypertension, unspecified type   3. Malignant neoplasm of prostate ([place])   4. Therapeutic drug monitoring     Patient Instructions  Continue oxcarbazepine 300 mg twice daily, will check level today.  Refill given. We will also check CMP Return in 1 year or sooner if worse.    Per [facility] statutes,  patients with seizures are not allowed to drive until they have been seizure-free for six months.  Other recommendations include using caution when using heavy equipment or power tools. Avoid working on ladders or at heights. Take showers instead of baths.  Do not swim alone.  Ensure the water temperature is not too high on the home water heater. Do not go swimming alone. Do not lock yourself in a room alone (i.e. bathroom). When caring for infants or small children, sit down when holding, feeding, or changing them to minimize risk of injury to the child in the event you have a seizure. Maintain good sleep hygiene. Avoid alcohol.  Also recommend adequate sleep, hydration, good diet and minimize stress.   During the Seizure  - First, ensure adequate ventilation and place patients on the floor on their left side  Loosen clothing around the neck and ensure the airway is patent. If the patient is clenching the teeth, do not force the mouth open with any object as this can cause severe damage - Remove all items from the surrounding that can be hazardous. The patient may be oblivious to what's happening and may not even know what he or she is doing. If the patient is confused and wandering, either gently guide him/her away and block access to  outside areas - Reassure the individual and be comforting - Call 911. In most cases, the seizure ends before EMS arrives. However, there are cases when seizures may last over 3 to 5 minutes. Or the individual may have developed breathing difficulties or severe injuries. If a pregnant patient or a person with diabetes develops a seizure, it is prudent to call an ambulance. - Finally, if the patient does not regain full consciousness, then call EMS. Most patients will remain confused for about 45 to 90 minutes after a seizure, so you must use judgment in calling for help. - Avoid restraints but make sure the patient is in a bed with padded side rails - Place the individual  in a lateral position with the neck slightly flexed; this will help the saliva drain from the mouth and prevent the tongue from falling backward - Remove all nearby furniture and other hazards from the area - Provide verbal assurance as the individual is regaining consciousness - Provide the patient with privacy if possible - Call for help and start treatment as ordered by the caregiver   After the Seizure (Postictal Stage)  After a seizure, most patients experience confusion, fatigue, muscle pain and/or a headache. Thus, one should permit the individual to sleep. For the next few days, reassurance is essential. Being calm and helping reorient the person is also of importance.  Most seizures are painless and end spontaneously. Seizures are not harmful to others but can lead to complications such as stress on the lungs, brain and the heart. Individuals with prior lung problems may develop labored breathing and respiratory distress.     Orders Placed This Encounter  Procedures   10-Hydroxycarbazepine   CMP     Meds ordered this encounter  Medications   Oxcarbazepine (TRILEPTAL) 300 MG tablet    Sig: Take 1 tablet (300 mg total) by mouth 2 (two) times daily.    Dispense:  60 tablet    Refill:  11     Return in about 1 year (around 12/10/2022).    Alric Ran, MD 12/10/2021, 12:00 PM  [facility] [address] [place], [place] [address] [phone]

## 2021-12-12 LAB — COMPREHENSIVE METABOLIC PANEL
ALT: 14 IU/L (ref 0–44)
AST: 16 IU/L (ref 0–40)
Albumin/Globulin Ratio: 1.8 (ref 1.2–2.2)
Albumin: 4.5 g/dL (ref 3.8–4.8)
Alkaline Phosphatase: 86 IU/L (ref 44–121)
BUN/Creatinine Ratio: 11 (ref 10–24)
BUN: 15 mg/dL (ref 8–27)
Bilirubin Total: <0.2 mg/dL (ref 0.0–1.2)
CO2: 21 mmol/L (ref 20–29)
Calcium: 9.9 mg/dL (ref 8.6–10.2)
Chloride: 108 mmol/L — ABNORMAL HIGH (ref 96–106)
Creatinine, Ser: 1.42 mg/dL — ABNORMAL HIGH (ref 0.76–1.27)
Globulin, Total: 2.5 g/dL (ref 1.5–4.5)
Glucose: 98 mg/dL (ref 70–99)
Potassium: 4.6 mmol/L (ref 3.5–5.2)
Sodium: 144 mmol/L (ref 134–144)
Total Protein: 7 g/dL (ref 6.0–8.5)
eGFR: 56 mL/min/{1.73_m2} — ABNORMAL LOW (ref >59)

## 2021-12-12 LAB — 10-HYDROXYCARBAZEPINE: Oxcarbazepine SerPl-Mcnc: 8 ug/mL — ABNORMAL LOW (ref 10–35)

## 2021-12-23 ENCOUNTER — Telehealth: Payer: Self-pay | Admitting: *Deleted

## 2021-12-23 NOTE — Telephone Encounter (Signed)
Called  patient to inform that implant has been moved to 01-31-22 @ 12:45 pm, spoke with patient and he is aware of this procedure being moved

## 2022-01-17 ENCOUNTER — Telehealth: Payer: Self-pay | Admitting: *Deleted

## 2022-01-17 NOTE — Telephone Encounter (Signed)
RETURNED PATIENT'S PHONE CALL, LVM FOR A RETURN CALL 

## 2022-01-27 ENCOUNTER — Encounter (HOSPITAL_BASED_OUTPATIENT_CLINIC_OR_DEPARTMENT_OTHER): Payer: Self-pay | Admitting: Urology

## 2022-01-27 ENCOUNTER — Other Ambulatory Visit: Payer: Self-pay

## 2022-01-27 NOTE — Progress Notes (Signed)
Spoke w/ via phone for pre-op interview: patient ?Lab needs dos: I-Stat ?Lab results: EKG 11/28/21, Echo 11/18/15; ?COVID test: patient states asymptomatic no test needed. ?Arrive at 1045 01/31/22 ?NPO after MN except clear liquids.Clear liquids from MN until 0945 ?Med rec completed. ?Medications to take morning of surgery: Trileptal, Vesicare, carvedilol, and cardizem ?Diabetic medication: none morning of sx ?Patient instructed to bring photo id and insurance card day of surgery. ?Patient aware to have Driver (ride ) / caregiver for 24 hours after surgery. (Wife to drive) ?Patient Special Instructions: Take fleet enema 1 hour prior to arrival. ?Pre-Op special Istructions: NA ?Patient verbalized understanding of instructions that were given at this phone interview. ?Patient denies shortness of breath, chest pain, fever, cough at this phone interview.  ? ?Pt states last seizure was Nov 2022. ?

## 2022-01-30 ENCOUNTER — Telehealth: Payer: Self-pay | Admitting: *Deleted

## 2022-01-30 NOTE — Telephone Encounter (Signed)
CALLED PATIENT TO REMIND OF PROCEDURE FOR 01-31-22, SPOKE WITH PATIENT AND HE IS AWARE OF THIS PROCEDURE ?

## 2022-01-31 ENCOUNTER — Ambulatory Visit (HOSPITAL_BASED_OUTPATIENT_CLINIC_OR_DEPARTMENT_OTHER): Payer: 59 | Admitting: Anesthesiology

## 2022-01-31 ENCOUNTER — Encounter (HOSPITAL_BASED_OUTPATIENT_CLINIC_OR_DEPARTMENT_OTHER): Payer: Self-pay | Admitting: Urology

## 2022-01-31 ENCOUNTER — Ambulatory Visit (HOSPITAL_COMMUNITY): Payer: 59

## 2022-01-31 ENCOUNTER — Ambulatory Visit (HOSPITAL_BASED_OUTPATIENT_CLINIC_OR_DEPARTMENT_OTHER)
Admission: RE | Admit: 2022-01-31 | Discharge: 2022-01-31 | Disposition: A | Discharge status: Home / Self Care | Payer: 59 | Attending: Urology | Admitting: Urology

## 2022-01-31 ENCOUNTER — Encounter (HOSPITAL_BASED_OUTPATIENT_CLINIC_OR_DEPARTMENT_OTHER)
Admission: RE | Disposition: A | Discharge status: Home / Self Care | Payer: Self-pay | Source: Home / Self Care | Attending: Urology

## 2022-01-31 DIAGNOSIS — I129 Hypertensive chronic kidney disease with stage 1 through stage 4 chronic kidney disease, or unspecified chronic kidney disease: Secondary | ICD-10-CM | POA: Insufficient documentation

## 2022-01-31 DIAGNOSIS — E1122 Type 2 diabetes mellitus with diabetic chronic kidney disease: Secondary | ICD-10-CM | POA: Insufficient documentation

## 2022-01-31 DIAGNOSIS — C61 Malignant neoplasm of prostate: Secondary | ICD-10-CM | POA: Diagnosis present

## 2022-01-31 DIAGNOSIS — Z7984 Long term (current) use of oral hypoglycemic drugs: Secondary | ICD-10-CM | POA: Diagnosis not present

## 2022-01-31 DIAGNOSIS — N183 Chronic kidney disease, stage 3 unspecified: Secondary | ICD-10-CM | POA: Insufficient documentation

## 2022-01-31 DIAGNOSIS — E1165 Type 2 diabetes mellitus with hyperglycemia: Secondary | ICD-10-CM | POA: Diagnosis not present

## 2022-01-31 DIAGNOSIS — Z87891 Personal history of nicotine dependence: Secondary | ICD-10-CM | POA: Diagnosis not present

## 2022-01-31 HISTORY — PX: SPACE OAR INSTILLATION: SHX6769

## 2022-01-31 HISTORY — PX: RADIOACTIVE SEED IMPLANT: SHX5150

## 2022-01-31 HISTORY — PX: CYSTOSCOPY: SHX5120

## 2022-01-31 HISTORY — DX: Type 2 diabetes mellitus without complications: E11.9

## 2022-01-31 HISTORY — DX: Other specified postprocedural states: R11.2

## 2022-01-31 HISTORY — DX: Other specified postprocedural states: Z98.890

## 2022-01-31 HISTORY — DX: Epilepsy, unspecified, not intractable, without status epilepticus: G40.909

## 2022-01-31 LAB — POCT I-STAT, CHEM 8
BUN: 24 mg/dL — ABNORMAL HIGH (ref 8–23)
Calcium, Ion: 1.23 mmol/L (ref 1.15–1.40)
Chloride: 105 mmol/L (ref 98–111)
Creatinine, Ser: 1.2 mg/dL (ref 0.61–1.24)
Glucose, Bld: 138 mg/dL — ABNORMAL HIGH (ref 70–99)
HCT: 44 % (ref 39.0–52.0)
Hemoglobin: 15 g/dL (ref 13.0–17.0)
Potassium: 4.2 mmol/L (ref 3.5–5.1)
Sodium: 137 mmol/L (ref 135–145)
TCO2: 24 mmol/L (ref 22–32)

## 2022-01-31 LAB — GLUCOSE, CAPILLARY: Glucose-Capillary: 127 mg/dL — ABNORMAL HIGH (ref 70–99)

## 2022-01-31 SURGERY — INSERTION, RADIATION SOURCE, PROSTATE
Anesthesia: General | Site: Urethra

## 2022-01-31 MED ORDER — MIDAZOLAM HCL 2 MG/2ML IJ SOLN
INTRAMUSCULAR | Status: AC
  Filled 2022-01-31: qty 2

## 2022-01-31 MED ORDER — ONDANSETRON HCL 4 MG/2ML IJ SOLN
INTRAMUSCULAR | Status: DC | PRN
  Administered 2022-01-31: 4 mg via INTRAVENOUS

## 2022-01-31 MED ORDER — AMISULPRIDE (ANTIEMETIC) 5 MG/2ML IV SOLN: 10.0000 mg | Freq: Once | INTRAVENOUS | Status: DC | PRN

## 2022-01-31 MED ORDER — FENTANYL CITRATE (PF) 100 MCG/2ML IJ SOLN
INTRAMUSCULAR | Status: DC | PRN
  Administered 2022-01-31: 50 ug via INTRAVENOUS

## 2022-01-31 MED ORDER — CIPROFLOXACIN IN D5W 400 MG/200ML IV SOLN
INTRAVENOUS | Status: AC
  Filled 2022-01-31: qty 200

## 2022-01-31 MED ORDER — DEXAMETHASONE SODIUM PHOSPHATE 4 MG/ML IJ SOLN
INTRAMUSCULAR | Status: DC | PRN
  Administered 2022-01-31: 6 mg via INTRAVENOUS

## 2022-01-31 MED ORDER — STERILE WATER FOR IRRIGATION IR SOLN
Status: DC | PRN
  Administered 2022-01-31: 500 mL

## 2022-01-31 MED ORDER — IOHEXOL 300 MG/ML  SOLN
INTRAMUSCULAR | Status: DC | PRN
  Administered 2022-01-31: 7 mL

## 2022-01-31 MED ORDER — ACETAMINOPHEN 500 MG PO TABS
ORAL_TABLET | ORAL | Status: AC
  Filled 2022-01-31: qty 2

## 2022-01-31 MED ORDER — ACETAMINOPHEN 500 MG PO TABS
1000.0000 mg | ORAL_TABLET | Freq: Once | ORAL | Status: AC
  Administered 2022-01-31: 1000 mg via ORAL

## 2022-01-31 MED ORDER — SUGAMMADEX SODIUM 200 MG/2ML IV SOLN
INTRAVENOUS | Status: DC | PRN
  Administered 2022-01-31: 200 mg via INTRAVENOUS

## 2022-01-31 MED ORDER — LACTATED RINGERS IV SOLN: INTRAVENOUS | Status: DC

## 2022-01-31 MED ORDER — ALFUZOSIN HCL ER 10 MG PO TB24: 10.0000 mg | ORAL_TABLET | Freq: Every day | ORAL | 0 refills | Status: DC

## 2022-01-31 MED ORDER — FLEET ENEMA 7-19 GM/118ML RE ENEM: 1.0000 | ENEMA | Freq: Once | RECTAL | Status: DC

## 2022-01-31 MED ORDER — MENTHOL 3 MG MT LOZG
LOZENGE | OROMUCOSAL | Status: AC
  Filled 2022-01-31: qty 9

## 2022-01-31 MED ORDER — MIDAZOLAM HCL 5 MG/5ML IJ SOLN
INTRAMUSCULAR | Status: DC | PRN
  Administered 2022-01-31: 2 mg via INTRAVENOUS

## 2022-01-31 MED ORDER — SODIUM CHLORIDE (PF) 0.9 % IJ SOLN
INTRAMUSCULAR | Status: DC | PRN
  Administered 2022-01-31: 10 mL via INTRAVENOUS

## 2022-01-31 MED ORDER — HYDROMORPHONE HCL 1 MG/ML IJ SOLN: 0.2500 mg | INTRAMUSCULAR | Status: DC | PRN

## 2022-01-31 MED ORDER — FENTANYL CITRATE (PF) 100 MCG/2ML IJ SOLN
INTRAMUSCULAR | Status: AC
  Filled 2022-01-31: qty 2

## 2022-01-31 MED ORDER — ROCURONIUM BROMIDE 10 MG/ML (PF) SYRINGE
PREFILLED_SYRINGE | INTRAVENOUS | Status: DC | PRN
  Administered 2022-01-31: 100 mg via INTRAVENOUS

## 2022-01-31 MED ORDER — OXYCODONE HCL 5 MG PO TABS: 5.0000 mg | ORAL_TABLET | Freq: Once | ORAL | Status: DC | PRN

## 2022-01-31 MED ORDER — PHENYLEPHRINE HCL (PRESSORS) 10 MG/ML IV SOLN
INTRAVENOUS | Status: DC | PRN
  Administered 2022-01-31 (×3): 80 ug via INTRAVENOUS

## 2022-01-31 MED ORDER — OXYCODONE HCL 5 MG/5ML PO SOLN: 5.0000 mg | Freq: Once | ORAL | Status: DC | PRN

## 2022-01-31 MED ORDER — ONDANSETRON HCL 4 MG/2ML IJ SOLN: 4.0000 mg | Freq: Once | INTRAMUSCULAR | Status: DC | PRN

## 2022-01-31 MED ORDER — PROPOFOL 10 MG/ML IV BOLUS
INTRAVENOUS | Status: DC | PRN
  Administered 2022-01-31: 150 mg via INTRAVENOUS

## 2022-01-31 MED ORDER — CIPROFLOXACIN IN D5W 400 MG/200ML IV SOLN
400.0000 mg | INTRAVENOUS | Status: AC
  Administered 2022-01-31: 400 mg via INTRAVENOUS

## 2022-01-31 MED ORDER — LIDOCAINE 2% (20 MG/ML) 5 ML SYRINGE
INTRAMUSCULAR | Status: DC | PRN
  Administered 2022-01-31: 60 mg via INTRAVENOUS

## 2022-01-31 MED ORDER — SODIUM CHLORIDE 0.9 % IV SOLN
INTRAVENOUS | Status: AC | PRN
  Administered 2022-01-31: 300 mL

## 2022-01-31 SURGICAL SUPPLY — 44 items
BAG DRN RND TRDRP ANRFLXCHMBR (UROLOGICAL SUPPLIES) ×2
BAG URINE DRAIN 2000ML AR STRL (UROLOGICAL SUPPLIES) ×3 IMPLANT
BLADE CLIPPER SENSICLIP SURGIC (BLADE) ×3 IMPLANT
Bard Quicklink Cartridges with Brachy Source I-125 ×77 IMPLANT
CATH FOLEY 2WAY SLVR  5CC 16FR (CATHETERS) ×3
CATH FOLEY 2WAY SLVR 5CC 16FR (CATHETERS) ×2 IMPLANT
CATH ROBINSON RED A/P 16FR (CATHETERS) ×1 IMPLANT
CATH ROBINSON RED A/P 20FR (CATHETERS) ×3 IMPLANT
CLOTH BEACON ORANGE TIMEOUT ST (SAFETY) ×3 IMPLANT
CNTNR URN SCR LID CUP LEK RST (MISCELLANEOUS) ×2 IMPLANT
CONT SPEC 4OZ STRL OR WHT (MISCELLANEOUS) ×3
COVER BACK TABLE 60X90IN (DRAPES) ×3 IMPLANT
COVER MAYO STAND STRL (DRAPES) ×3 IMPLANT
DRSG TEGADERM 4X4.75 (GAUZE/BANDAGES/DRESSINGS) ×3 IMPLANT
DRSG TEGADERM 8X12 (GAUZE/BANDAGES/DRESSINGS) ×3 IMPLANT
GAUZE SPONGE 4X4 12PLY STRL (GAUZE/BANDAGES/DRESSINGS) ×1 IMPLANT
GEL ULTRASOUND 20GR AQUASONIC (MISCELLANEOUS) ×3 IMPLANT
GLOVE SURG ENC MOIS LTX SZ7.5 (GLOVE) ×3 IMPLANT
GLOVE SURG ORTHO LTX SZ8.5 (GLOVE) ×3 IMPLANT
GLOVE SURG POLYISO LF SZ7 (GLOVE) ×2 IMPLANT
GLOVE SURG UNDER POLY LF SZ6.5 (GLOVE) ×1 IMPLANT
GLOVE SURG UNDER POLY LF SZ7 (GLOVE) ×1 IMPLANT
GOWN STRL REUS W/TWL LRG LVL3 (GOWN DISPOSABLE) ×4 IMPLANT
GRID BRACH TEMP 18GA 2.8X3X.75 (MISCELLANEOUS) ×3 IMPLANT
HOLDER FOLEY CATH W/STRAP (MISCELLANEOUS) ×3 IMPLANT
IMPL SPACEOAR VUE SYSTEM (Spacer) ×2 IMPLANT
IMPLANT SPACEOAR VUE SYSTEM (Spacer) ×3 IMPLANT
IV NS 1000ML (IV SOLUTION) ×3
IV NS 1000ML BAXH (IV SOLUTION) ×2 IMPLANT
KIT TURNOVER CYSTO (KITS) ×3 IMPLANT
NDL BRACHY 18G 5PK (NEEDLE) ×8 IMPLANT
NDL BRACHY 18G SINGLE (NEEDLE) IMPLANT
NDL PK MORGANSTERN STABILIZ (NEEDLE) ×2 IMPLANT
NEEDLE BRACHY 18G 5PK (NEEDLE) ×12 IMPLANT
NEEDLE BRACHY 18G SINGLE (NEEDLE) ×3 IMPLANT
NEEDLE PK MORGANSTERN STABILIZ (NEEDLE) ×3 IMPLANT
PACK CYSTO (CUSTOM PROCEDURE TRAY) ×3 IMPLANT
SHEATH ULTRASOUND LF (SHEATH) IMPLANT
SHEATH ULTRASOUND LTX NONSTRL (SHEATH) IMPLANT
SYR 10ML LL (SYRINGE) ×3 IMPLANT
SYR CONTROL 10ML LL (SYRINGE) ×3 IMPLANT
TOWEL OR 17X26 10 PK STRL BLUE (TOWEL DISPOSABLE) ×3 IMPLANT
UNDERPAD 30X36 HEAVY ABSORB (UNDERPADS AND DIAPERS) ×6 IMPLANT
WATER STERILE IRR 500ML POUR (IV SOLUTION) ×3 IMPLANT

## 2022-01-31 NOTE — Anesthesia Postprocedure Evaluation (Signed)
Anesthesia Post Note ? ?Patient: Daniel Yoder ? ?Procedure(s) Performed: RADIOACTIVE SEED IMPLANT/BRACHYTHERAPY IMPLANT (Prostate) ?SPACE OAR INSTILLATION (Prostate) ?CYSTOSCOPY FLEXIBLE (Urethra) ? ?  ? ?Patient location during evaluation: PACU ?Anesthesia Type: General ?Level of consciousness: awake and alert, oriented and patient cooperative ?Pain management: pain level controlled ?Vital Signs Assessment: post-procedure vital signs reviewed and stable ?Respiratory status: spontaneous breathing, nonlabored ventilation and respiratory function stable ?Cardiovascular status: blood pressure returned to baseline and stable ?Postop Assessment: no apparent nausea or vomiting ?Anesthetic complications: no ? ? ?No notable events documented. ? ?Last Vitals:  ?Vitals:  ? 01/31/22 1119  ?BP: 125/88  ?Pulse: 69  ?Resp: 20  ?Temp: 36.5 ?C  ?SpO2: 97%  ?  ?Last Pain:  ?Vitals:  ? 01/31/22 1119  ?TempSrc: Oral  ?PainSc: 0-No pain  ? ? ?  ?  ?  ?  ?  ?  ? ?Jarome Matin [name] ? ? ? ? ?

## 2022-01-31 NOTE — Transfer of Care (Signed)
Immediate Anesthesia Transfer of Care Note ? ?Patient: SPARROW SANZO ? ?Procedure(s) Performed: RADIOACTIVE SEED IMPLANT/BRACHYTHERAPY IMPLANT (Prostate) ?SPACE OAR INSTILLATION (Prostate) ?CYSTOSCOPY FLEXIBLE (Urethra) ? ?Patient Location: PACU ? ?Anesthesia Type:General ? ?Level of Consciousness: awake and alert  ? ?Airway & Oxygen Therapy: Patient Spontanous Breathing and Patient connected to face mask oxygen ? ?Post-op Assessment: Report given to RN and Post -op Vital signs reviewed and stable ? ?Post vital signs: Reviewed and stable ? ?Last Vitals:  ?Vitals Value Taken Time  ?BP 127/88 01/31/22 1423  ?Temp    ?Pulse 62 01/31/22 1425  ?Resp 16 01/31/22 1425  ?SpO2 100 % 01/31/22 1425  ?Vitals shown include unvalidated device data. ? ?Last Pain:  ?Vitals:  ? 01/31/22 1119  ?TempSrc: Oral  ?PainSc: 0-No pain  ?   ? ?Patients Stated Pain Goal: 6 (01/31/22 1119) ? ?Complications: No notable events documented. ?

## 2022-01-31 NOTE — Anesthesia Postprocedure Evaluation (Signed)
Anesthesia Post Note ? ?Patient: Daniel Yoder ? ?Procedure(s) Performed: RADIOACTIVE SEED IMPLANT/BRACHYTHERAPY IMPLANT (Prostate) ?SPACE OAR INSTILLATION (Prostate) ?CYSTOSCOPY FLEXIBLE (Urethra) ? ?  ? ?Patient location during evaluation: PACU ?Anesthesia Type: General ?Level of consciousness: awake and alert, oriented and patient cooperative ?Pain management: pain level controlled ?Vital Signs Assessment: post-procedure vital signs reviewed and stable ?Respiratory status: spontaneous breathing, nonlabored ventilation and respiratory function stable ?Cardiovascular status: blood pressure returned to baseline and stable ?Postop Assessment: no apparent nausea or vomiting ?Anesthetic complications: no ? ? ?No notable events documented. ? ?Last Vitals:  ?Vitals:  ? 01/31/22 1119  ?BP: 125/88  ?Pulse: 69  ?Resp: 20  ?Temp: 36.5 ?C  ?SpO2: 97%  ?  ?Last Pain:  ?Vitals:  ? 01/31/22 1119  ?TempSrc: Oral  ?PainSc: 0-No pain  ? ? ?  ?  ?  ?  ?  ?  ? ?Jarome Matin Venkat Ankney ? ? ? ? ?

## 2022-01-31 NOTE — H&P (Signed)
H&P ? ?Chief Complaint: Prostate cancer ? ?History of Present Illness: Daniel Yoder is a 66 year old male who has early intermediate risk prostate cancer.  His PSA rose to 5.22 by June 2022.  PSA was 5.12 in September 2022.  He underwent prostate biopsy November 2022 which revealed a 20 g prostate, normal exam, grade group 2 disease in 5 cores, 30 to 80% (Gleason 4 component 10 to 30%); and grade group 1 disease in 3 cores 5 to 50%.  8 out of the 12 biopsies had cancer.  He presents today for brachytherapy and SpaceOAR biodegradable gel insertion.  He has been well without complaints.  No voiding complaints.  No dysuria or gross hematuria.  No fever, cough or congestion. ? ?Past Medical History:  ?Diagnosis Date  ? Diabetes mellitus without complication ([place])   ? Epilepsy ([place])   ? Hypertension   ? PONV (postoperative nausea and vomiting)   ? WPW (Wolff-Parkinson-White syndrome) 11/03/2001  ? Ablation 2003  ? ?Past Surgical History:  ?Procedure Laterality Date  ? ROTATOR CUFF REPAIR Right   ? X2 by Dr. Rhona Raider  ? ? ?Home Medications:  ?Medications Prior to Admission  ?Medication Sig Dispense Refill Last Dose  ? Blood Glucose Monitoring Suppl (ONETOUCH VERIO) w/Device KIT See admin instructions.   01/30/2022  ? carvedilol (COREG) 25 MG tablet 1 tablet   01/31/2022 at 08000  ? diclofenac (VOLTAREN) 75 MG EC tablet Take 75 mg by mouth daily.   01/27/2022  ? diltiazem (CARDIZEM CD) 120 MG 24 hr capsule Take 120 mg by mouth daily.   01/30/2022  ? Empagliflozin-metFORMIN HCl ER (SYNJARDY XR) 12.03-999 MG TB24 1 tablet   01/30/2022  ? meloxicam (MOBIC) 7.5 MG tablet    Past Month  ? Oxcarbazepine (TRILEPTAL) 300 MG tablet Take 1 tablet (300 mg total) by mouth 2 (two) times daily. 60 tablet 11 01/31/2022 at 0800  ? rosuvastatin (CRESTOR) 10 MG tablet Take 10 mg by mouth at bedtime.   01/30/2022  ? solifenacin (VESICARE) 5 MG tablet Take 5 mg by mouth daily.   01/31/2022 at 0800  ? tadalafil (CIALIS) 5 MG tablet    Past Month  ?  valsartan-hydrochlorothiazide (DIOVAN-HCT) 320-25 MG tablet   12 01/31/2022 at 0800  ? ?Allergies:  ?Allergies  ?Allergen Reactions  ? Amoxicillin   ?  Other reaction(s): severe nausea  ? Penicillins Nausea Only  ? Septra [Sulfamethoxazole-Trimethoprim]   ?  Other reaction(s): GI Upset (05/2018)  ? ? ?Family History  ?Problem Relation Age of Onset  ? Hypertension Brother   ? Diabetes Mellitus II Brother   ? Heart disease Brother   ?     Prostate Cancer  ? Diabetes Mellitus II Brother   ? Hypertension Brother   ? Hypertension Mother   ? Heart disease Mother   ?     CHF  ? Stroke Father   ? Hypertension Father   ? Diabetes Mellitus II Father   ? ?Social History:  reports that he has quit smoking. His smoking use included cigarettes. He has never used smokeless tobacco. He reports that he does not drink alcohol and does not use drugs. ? ?ROS: ?A complete review of systems was performed.  All systems are negative except for pertinent findings as noted. ?Review of Systems  ?All other systems reviewed and are negative. ? ? ?Physical Exam:  ?Vital signs in last 24 hours: ?Temp:  [97.7 ?F (36.5 ?C)] 97.7 ?F (36.5 ?C) (03/31 1119) ?Pulse Rate:  [69] 69 (03/31  1119) ?Resp:  [20] 20 (03/31 1119) ?BP: (125)/(88) 125/88 (03/31 1119) ?SpO2:  [97 %] 97 % (03/31 1119) ?Weight:  [95.5 kg] 95.5 kg (03/31 1119) ?General:  Alert and oriented, No acute distress ?HEENT: Normocephalic, atraumatic ?Cardiovascular: Regular rate and rhythm ?Lungs: Regular rate and effort ?Abdomen: Soft, nontender, nondistended, no abdominal masses ?Back: No CVA tenderness ?Extremities: No edema ?Neurologic: Grossly intact ? ?Laboratory Data:  ?Results for orders placed or performed during the hospital encounter of 01/31/22 (from the past 24 hour(s))  ?I-STAT, chem 8     Status: Abnormal  ? Collection Time: 01/31/22 11:33 AM  ?Result Value Ref Range  ? Sodium 137 135 - 145 mmol/L  ? Potassium 4.2 3.5 - 5.1 mmol/L  ? Chloride 105 98 - 111 mmol/L  ? BUN 24 (H) 8  - 23 mg/dL  ? Creatinine, Ser 1.20 0.61 - 1.24 mg/dL  ? Glucose, Bld 138 (H) 70 - 99 mg/dL  ? Calcium, Ion 1.23 1.15 - 1.40 mmol/L  ? TCO2 24 22 - 32 mmol/L  ? Hemoglobin 15.0 13.0 - 17.0 g/dL  ? HCT 44.0 39.0 - 52.0 %  ? ?No results found for this or any previous visit (from the past 240 hour(s)). ?Creatinine: ?Recent Labs  ?  01/31/22 ?1133  ?CREATININE 1.20  ? ? ?Impression/Assessment:  ?Early intermediate risk prostate cancer ? ?Plan:  ?I discussed with the patient the nature, potential benefits, risks and alternatives to brachytherapy of the prostate permanent seed and biodegradable gel insertion, including side effects of the proposed treatment, the likelihood of the patient achieving the goals of the procedure, and any potential problems that might occur during the procedure or recuperation.  We discussed risk of urethral, bladder or rectal injury among others.  All questions answered. Patient elects to proceed. ? ? ?Daniel Yoder ?01/31/2022, 12:17 PM  ? ?

## 2022-01-31 NOTE — Op Note (Signed)
Preoperative diagnosis: Stage I (T1cNxMx) Prostate cancer ?Postoperative diagnosis: Same ?  ?Procedure: Prostate brachytherapy seed implant, ?Cystoscopy, SpaceOAR biodegradable gel insertion ?  ?Surgeon: Junious Silk ?  ?Radiation oncologist: Tammi Klippel ?  ?Anesthesia: Gen. ?  ?Indication for procedure: 66 year old with stage I prostate cancer who elected to proceed with prostate brachytherapy. ?  ?Findings: On fluoroscopic imaging there was adequate coverage of the prostate. On cystoscopy the urethra appeared normal, the prostatic urethra appeared normal, the trigone and ureteral orifices appeared normal with clear efflux. The bladder mucosa appeared normal. There were no stones, foreign bodies or seeds visualized in the bladder. ?  ?Dose: 145 Gy ?  ?Description of procedure: After consent was obtained patient brought to the operating room. After adequate anesthesia he is placed in lithotomy position and the transrectal ultrasound probe and perineal template positioned. Catheters and brachytherapy seeds were placed per Dr. Johny Shears plan. A total of 20 catheters and 77 active sources (I-125) were placed. The anchoring needles, template and ultrasound were removed. Scout flouro imaging was obtained of the implant. The Foley was removed.  Another image was obtained.   ? ?The 18-gauge needle was then inserted approximately 1 to 2 cm anterior to the anal opening and directed under ultrasonic guidance into the perirectal fat between the anterior rectal wall and the prostate capsule down to the mid-gland. Midline needle position was confirmed in the sagittal and axial views to verify the tip was in the perirectal fat.  Small amounts of saline were injected to hydrodissect the space between the prostate and the anterior rectal wall.  Axial imaging was viewed to confirm the needle was in the correct location in the mid gland and centered.  Aspiration confirmed no intravascular access.  The saline syringe was carefully  disconnected maintaining the desired needle position and the hydrogel was attached to the needle.  Under ultrasound guidance in the sagittal view a smooth continuous injection was done over about 12 seconds delivering the hydrogel into the space between the prostate and rectal wall.  The needle was withdrawn. ? ?The patient was prepped again and cystoscopy was performed which was noted to be normal.  ? ?He was awakened taken to the recovery room in stable condition. ?  ?Complications: None ?  ?Blood loss: Minimal ?  ?Specimens: None ?  ?Drains: None  ?  ?Disposition: Patient stable to PACU.  ? ?

## 2022-01-31 NOTE — Anesthesia Procedure Notes (Signed)
Procedure Name: Intubation ?Date/Time: 01/31/2022 12:50 PM ?Performed by: Lieutenant Diego, CRNA ?Pre-anesthesia Checklist: Patient identified, Emergency Drugs available, Suction available and Patient being monitored ?Patient Re-evaluated:Patient Re-evaluated prior to induction ?Oxygen Delivery Method: Circle system utilized ?Preoxygenation: Pre-oxygenation with 100% oxygen ?Induction Type: IV induction ?Ventilation: Mask ventilation without difficulty ?Laryngoscope Size: Sabra Heck and 2 ?Grade View: Grade I ?Tube type: Oral ?Tube size: 7.5 mm ?Number of attempts: 1 ?Airway Equipment and Method: Stylet ?Placement Confirmation: ETT inserted through vocal cords under direct vision, positive ETCO2 and breath sounds checked- equal and bilateral ?Secured at: 23 cm ?Tube secured with: Tape ?Dental Injury: Teeth and Oropharynx as per pre-operative assessment  ? ? ? ? ?

## 2022-01-31 NOTE — Anesthesia Preprocedure Evaluation (Addendum)
Anesthesia Evaluation  ?Patient identified by MRN, date of birth, ID band ?Patient awake ? ? ? ?Reviewed: ?Allergy & Precautions, NPO status , Patient's Chart, lab work & pertinent test results, reviewed documented beta blocker date and time  ? ?History of Anesthesia Complications ?(+) PONV and history of anesthetic complications (not w/ every anesthetic) ? ?Airway ?Mallampati: III ? ?TM Distance: >3 FB ?Neck ROM: Full ? ? ? Dental ? ?(+) Teeth Intact, Dental Advisory Given ?  ?Pulmonary ?neg pulmonary ROS, former smoker,  ?  ?Pulmonary exam normal ?breath sounds clear to auscultation ? ? ? ? ? ? Cardiovascular ?hypertension, Pt. on medications and Pt. on home beta blockers ?Normal cardiovascular exam+ dysrhythmias (hx WP Ws/p ablation 2003)  ?Rhythm:Regular Rate:Normal ? ?Echo 2017 ?LVEF 60-65%, mild LVH, normal wall motion, diastolic dysfunction?with normal LV filling pressure, mild RAE.  ?  ?Neuro/Psych ? Headaches, Seizures -, Well Controlled,  negative psych ROS  ? GI/Hepatic ?negative GI ROS, Neg liver ROS,   ?Endo/Other  ?diabetes, Poorly Controlled, Type 2, Oral Hypoglycemic AgentsLast a1c 2021 13.7 ?FS in preop 138 ? Renal/GU ?Renal InsufficiencyRenal diseaseCKD 3, Cr 1.4  ? ?Prostate ca  ? ?  ?Musculoskeletal ? ?(+) Arthritis , Osteoarthritis,   ? Abdominal ?  ?Peds ? Hematology ?negative hematology ROS ?(+)   ?Anesthesia Other Findings ? ? Reproductive/Obstetrics ?negative OB ROS ? ?  ? ? ? ? ? ? ? ? ? ? ? ? ? ?  ?  ? ? ? ? ? ? ?Anesthesia Physical ?Anesthesia Plan ? ?ASA: 3 ? ?Anesthesia Plan: General  ? ?Post-op Pain Management: Tylenol PO (pre-op)*  ? ?Induction: Intravenous ? ?PONV Risk Score and Plan: 4 or greater and Ondansetron, Dexamethasone, Midazolam and Treatment may vary due to age or medical condition ? ?Airway Management Planned: Oral ETT ? ?Additional Equipment: None ? ?Intra-op Plan:  ? ?Post-operative Plan: Extubation in OR ? ?Informed Consent: I have  reviewed the patients History and Physical, chart, labs and discussed the procedure including the risks, benefits and alternatives for the proposed anesthesia with the patient or authorized representative who has indicated his/her understanding and acceptance.  ? ? ? ?Dental advisory given ? ?Plan Discussed with: CRNA ? ?Anesthesia Plan Comments:   ? ? ? ? ? ?Anesthesia Quick Evaluation ? ?

## 2022-01-31 NOTE — Discharge Instructions (Addendum)
PROSTATE CANCER TREATMENT ?WITH RADIOACTIVE IODINE-125 SEED IMPLANT ? ?This instruction sheet is intended to discuss implantation of Iodine-125 seeds as treatment for cancer of the prostate. It will explain in detail what you may expect from this treatment and what precautions are necessary as a result of the treatment. Iodine-125 emits a relatively low energy radiation. The radioactive seeds are surgically implanted directly into the prostate gland. Most of ?the radiation is contained within the prostate gland. A very small amount is present outside the body.The precautions that we ask you to take are to ensure that those around you are protected from unnecessary radiation. The principles of radiation safety that you need to understand are: ? ?DISTANCE: The further a person is from the radioactive implant the less radiation they will be receiving. The amount of radiation received falls off quite rapidly with distance. More specific guidelines are given in the table on the last page. ? ?TIME: The amount of radiation a person is exposed to is directly proportional to the amount of time that is spent in close proximity to the radioactive implant. Very little radiation will be received during short periods. See the table on the last page for more specific guideline. ? ?CHILDREN UNDER AGE 44 ?Children should not be allowed to sit on your lap or otherwise be in very close contact for more than a few minutes for the first 6-8 weeks following the implant. You may affectionately greet (hug/kiss) a child for a short period of time, but remember, the longer you are in close proximity with that child the more radiation they are being exposed to. At a distance of 6 feet there is no limit to the length of time you may spend together. See specific guidelines on the last page. ? ?PREGNANT OR POSSIBLY PREGNANT WOMEN ?Pregnant women should avoid prolonged close physical contact with you for the first 6-8 weeks after implant. At a  distance of 6 feet there is no limit to the length of time you may spend together. Pregnant women or possibly pregnant women can safely be in close contact with you for a limited period of time. See the last page for guidelines. ? ?FAMILY RELATIONS ?You may sleep in the same bed as your partner (provided she is not pregnant or under the age of 25). Sexual intercourse, using a condom, may be resumed 2 weeks after the implant. Your semen may be discolored, dark brown or black. This is normal and is the result of bleeding that may have occurred during the implant. After 3-4 weeks it will not be necessary to use a condom. ? ?DAILY ACTIVITIES ?You may resume normal activities in a few days (example: work, shopping, church) without the risk of harmful radiation exposure to those around you provided you keep in mind the time and ?distance precautions. Objects that you touch or item that you use do not become radioactive. Linens, clothing, tableware, and dishes may be used by other persons without special precautions. Your bodily wastes (urine and stool) are not radioactive. ? ?SPECIAL PRECAUTIONS ?It is possible to lose implanted Iodine-125 seed(s) through urination. Although it is possible to pass seeds indefinitely, it is most likely to occur immediately after catheter removal. To prevent ?this from happening the catheter that was in place during the implant procedure is removed immediately after the implant and a cystoscopy procedure is performed. The process of removing the catheter and the cystoscopy procedure should dislodge and remove any seeds that are not firmly imbedded in the prostate  tissue. However, you should watch for seeds if/when you remove your catheter at home. The seeds are silver colored and the size of a grain of rice. In the unlikely event that a seed is seen after urination, simply flush the seed down the toilet. The seed should not be handled with your fingers, not even with a glove or napkin. A  spoon or tweezers can be used to pick up a seed. The Radiation Oncology department is open Monday - Friday from 8:00 am to 5:30 pm with a Radiation Oncologist on call at all times. He or she may be reached by calling [phone]. If you are to be hospitalized or if death should occur, your family should notify the Runner, broadcasting/film/video. ? ?SIDE EFFECTS ?There are very few side effects associate with the implant procedure. Minor burning with urination, weak stream, hesitancy, intermittency, frequency, mild pain or feeling unable to pass your urine freely are common and usually stop in one to four months. If these symptoms are extremely uncomfortable, contact your physician. ? ?RADIATION SAFETY GUIDELINES ?PROSTATE CANCER TREATMENT ?WITH RADIOACTIVE IODINE-125 SEED IMPLANT ? ?The following guidelines will limit exposure to less than naturally occurring background ?radiation. ? ?PERSONS AGE 77-45 (if able to become pregnant) ? ?FOR 8 WEEKS FOLLOWING IMPLANT ? ?At a distance of 1 foot: limit time to less than 2 hours/week ?At a distance of 3 feet: limit time to 20 hours/week ?At a distance of 6 feet: no restrictions ? ?AFTER 8 WEEKS ?No restrictions ? ?CHILDREN UNDER AGE 54, PREGNANT WOMEN OR POSSIBLY ?PREGNANT WOMEN ? ?FOR 8 WEEKS FOLLOWING IMPLANT ?At a distance of 1 foot: limit time to 10 minutes/week ?At a distance of 3 feet: limit time to 2 hours/week ?At a distance of 6 feet: no restrictions ? ?AFTER 8 WEEKS ?No restrictions ? ?PERSONS OVER THE AGE OF 45 AND DO NOT EXPECT TO HAVE ANY ?MORE CHILDREN ?No restrictions ? ?Updated by [facility] in January 2020 ? ?Post Anesthesia Home Care Instructions ? ?Activity: ?Get plenty of rest for the remainder of the day. A responsible individual must stay with you for 24 hours following the procedure.  ?For the next 24 hours, DO NOT: ?-Drive a car ?-Paediatric nurse ?-Drink alcoholic beverages ?-Take any medication unless instructed by your physician ?-Make any legal decisions  or sign important papers. ? ?Meals: ?Start with liquid foods such as gelatin or soup. Progress to regular foods as tolerated. Avoid greasy, spicy, heavy foods. If nausea and/or vomiting occur, drink only clear liquids until the nausea and/or vomiting subsides. Call your physician if vomiting continues. ? ?Special Instructions/Symptoms: ?Your throat may feel dry or sore from the anesthesia or the breathing tube placed in your throat during surgery. If this causes discomfort, gargle with warm salt water. The discomfort should disappear within 24 hours. ? ?No acetaminophen/Tylenol until after 5:30 pm today if needed. ? ?   ? ?

## 2022-02-02 NOTE — Progress Notes (Signed)
?  Radiation Oncology         [phone] ?________________________________ ? ?Name: Daniel Yoder MRN: [id number]  ?Date: 02/02/2022  DOB: 11-28-1955 ? ?     Prostate Seed Implant ? ?TI:RWER, Edwyna Shell, MD  No ref. provider found ? ?DIAGNOSIS:  66 y.o. gentleman with Stage T1c adenocarcinoma of the prostate with Gleason score of 3+4, and PSA of 5.23. ? ?Oncology History  ?Malignant neoplasm of prostate ([place])  ?09/20/2021 Cancer Staging  ? Staging form: Prostate, AJCC 8th Edition ?- Clinical stage from 09/20/2021: Stage IIB (cT1c, cN0, cM0, PSA: 5.2, Grade Group: 2) - Signed by Freeman Caldron, PA-C on 10/18/2021 ?Histopathologic type: Adenocarcinoma, NOS ?Stage prefix: Initial diagnosis ?Prostate specific antigen (PSA) range: Less than 10 ?Gleason primary pattern: 3 ?Gleason secondary pattern: 4 ?Gleason score: 7 ?Histologic grading system: 5 grade system ?Number of biopsy cores examined: 12 ?Number of biopsy cores positive: 8 ?Location of positive needle core biopsies: Both sides ? ?  ?10/18/2021 Initial Diagnosis  ? Malignant neoplasm of prostate ([place]) ?  ? ? ?  ICD-10-CM   ?1. Prostate cancer [facility])  [place] Discharge patient  ?  ? ? ?PROCEDURE: Insertion of radioactive I-125 seeds into the prostate gland. ? ?RADIATION DOSE: 145 Gy, definitive therapy. ? ?TECHNIQUE: Daniel Yoder was brought to the operating room with the urologist. He was placed in the dorsolithotomy position. He was catheterized and a rectal tube was inserted. The perineum was shaved, prepped and draped. The ultrasound probe was then introduced into the rectum to see the prostate gland. ? ?TREATMENT DEVICE: A needle grid was attached to the ultrasound probe stand and anchor needles were placed. ? ?3D PLANNING: The prostate was imaged in 3D using a sagittal sweep of the prostate probe. These images were transferred to the planning computer. There, the prostate, urethra and rectum were defined on each axial reconstructed image. Then, the software  created an optimized 3D plan and a few seed positions were adjusted. The quality of the plan was reviewed using [facility] information for the target and the following two organs at risk:  Urethra and Rectum.  Then the accepted plan was printed and handed off to the radiation therapist.  Under my supervision, the custom loading of the seeds and spacers was carried out and loaded into sealed vicryl sleeves.  These pre-loaded needles were then placed into the needle holder.. ? ?PROSTATE VOLUME STUDY:  Using transrectal ultrasound the volume of the prostate was verified to be 28.9 cc. ? ?SPECIAL TREATMENT PROCEDURE/SUPERVISION AND HANDLING: The pre-loaded needles were then delivered under sagittal guidance. A total of 20 needles were used to deposit 77 seeds in the prostate gland. The individual seed activity was 0.284 mCi. ? ?SpaceOAR:  Yes ? ?COMPLEX SIMULATION: At the end of the procedure, an anterior radiograph of the pelvis was obtained to document seed positioning and count. Cystoscopy was performed to check the urethra and bladder. ? ?MICRODOSIMETRY: At the end of the procedure, the patient was emitting 0.096 mR/hr at 1 meter. Accordingly, he was considered safe for hospital discharge. ? ?PLAN: The patient will return to the radiation oncology clinic for post implant CT dosimetry in three weeks. ? ? ?________________________________ ? ?Daniel Yoder, M.D. ? ? ? ?

## 2022-02-03 ENCOUNTER — Encounter (HOSPITAL_BASED_OUTPATIENT_CLINIC_OR_DEPARTMENT_OTHER): Payer: Self-pay | Admitting: Urology

## 2022-02-20 ENCOUNTER — Telehealth: Payer: Self-pay | Admitting: *Deleted

## 2022-02-20 NOTE — Telephone Encounter (Signed)
XXXX 

## 2022-02-20 NOTE — Telephone Encounter (Signed)
CALLED PATIENT TO REMIND OF POST SEED APPTS. FOR 02-21-22, SPOKE WITH PATIENT AND HE  IS AWARE OF THESE APPTS. ?

## 2022-02-21 ENCOUNTER — Ambulatory Visit
Admission: RE | Admit: 2022-02-21 | Discharge: 2022-02-21 | Disposition: A | Discharge status: Home / Self Care | Payer: 59 | Source: Ambulatory Visit | Attending: Radiation Oncology | Admitting: Radiation Oncology

## 2022-02-21 ENCOUNTER — Encounter: Payer: Self-pay | Admitting: Urology

## 2022-02-21 ENCOUNTER — Other Ambulatory Visit: Payer: Self-pay

## 2022-02-21 VITALS — BP 144/88 | HR 82 | Temp 97.7°F | Resp 20 | Ht 73.0 in | Wt 210.0 lb

## 2022-02-21 DIAGNOSIS — C61 Malignant neoplasm of prostate: Secondary | ICD-10-CM

## 2022-02-21 NOTE — Progress Notes (Signed)
Post-seed appointment. I verified patient identity and began nursing interview. Patient reports extreme fatigue, that caught him off guard. No other issues reported at this time. ? ?Meaningful use complete. ?I-PSS score of 11 (moderate). ?Currently on Uroxatral '10mg'$  as directed. ?Urology appointment July 12th and July 19th, 2023. ? ?BP (!) 144/88 (BP Location: Left Arm, Patient Position: Sitting, Cuff Size: Large)   Pulse 82   Temp 97.7 ?F (36.5 ?C)   Resp 20   Ht '6\' 1"'$  (1.854 m)   Wt 210 lb (95.3 kg)   SpO2 99%   BMI 27.71 kg/m?  ? ?

## 2022-02-21 NOTE — Progress Notes (Signed)
?Radiation Oncology         [phone] ?________________________________ ? ?Name: Daniel Yoder MRN: [id number]  ?Date: 02/21/2022  DOB: [birth year] ? ?Post-Seed Follow-Up Visit Note ? ?CC: Vernie Shanks, MD  Festus Aloe, MD ? ?Diagnosis:   66 y.o. gentleman with Stage T1c adenocarcinoma of the prostate with Gleason score of 3+4, and PSA of 5.23. ? ?  ICD-10-CM   ?1. Malignant neoplasm of prostate ([place])  C61   ?  ? ? ?Interval Since Last Radiation:  3 weeks ?01/31/22:  Insertion of radioactive I-125 seeds into the prostate gland; 145 Gy, definitive therapy with placement of SpaceOAR gel. ? ?Narrative:  The patient returns today for routine follow-up.  He is complaining of increased urinary frequency and urinary hesitation symptoms. He filled out a questionnaire regarding urinary function today providing and overall IPSS score of 11 characterizing his symptoms as moderate and improving on Flomax and Vesicare daily.  His pre-implant score was 21.  He specifically denies gross hematuria, dysuria, straining to void, incomplete bladder emptying or incontinence.  He is still getting up 3-4 times per night but reports that this is tolerable.  He denies any abdominal pain or bowel symptoms reports a healthy appetite, maintaining his weight.  He has had significant fatigue/decreased stamina since his procedure but otherwise, is quite pleased with his progress to date. ? ?ALLERGIES:  is allergic to amoxicillin, penicillins, and septra [sulfamethoxazole-trimethoprim]. ? ?Meds: ?Current Outpatient Medications  ?Medication Sig Dispense Refill  ? alfuzosin (UROXATRAL) 10 MG 24 hr tablet Take 1 tablet (10 mg total) by mouth daily with breakfast. 90 tablet 0  ? Blood Glucose Monitoring Suppl (ONETOUCH VERIO) w/Device KIT See admin instructions.    ? carvedilol (COREG) 25 MG tablet 1 tablet    ? diclofenac (VOLTAREN) 75 MG EC tablet Take 75 mg by mouth daily.    ? diltiazem (CARDIZEM CD) 120 MG 24 hr capsule Take 120 mg by  mouth daily.    ? Empagliflozin-metFORMIN HCl ER (SYNJARDY XR) 12.03-999 MG TB24 1 tablet    ? meloxicam (MOBIC) 7.5 MG tablet     ? Oxcarbazepine (TRILEPTAL) 300 MG tablet Take 1 tablet (300 mg total) by mouth 2 (two) times daily. 60 tablet 11  ? rosuvastatin (CRESTOR) 10 MG tablet Take 10 mg by mouth at bedtime.    ? solifenacin (VESICARE) 5 MG tablet Take 5 mg by mouth daily.    ? tadalafil (CIALIS) 5 MG tablet     ? valsartan-hydrochlorothiazide (DIOVAN-HCT) 320-25 MG tablet   12  ? ?No current facility-administered medications for this encounter.  ? ? ?Physical Findings: ?In general this is a well appearing African-American male in no acute distress. He's alert and oriented x4 and appropriate throughout the examination. Cardiopulmonary assessment is negative for acute distress and he exhibits normal effort.  ? ?Lab Findings: ?Lab Results  ?Component Value Date  ? WBC 9.4 07/16/2020  ? HGB 15.0 01/31/2022  ? HCT 44.0 01/31/2022  ? MCV 74.8 (L) 07/16/2020  ? PLT 329 07/16/2020  ? ? ?Radiographic Findings:  ?Patient underwent CT imaging in our clinic for post implant dosimetry. The CT will be reviewed by Dr. Tammi Klippel to confirm there is an adequate distribution of radioactive seeds throughout the prostate gland and ensure that there are no seeds in or near the rectum.  We suspect the final radiation plan and dosimetry will show appropriate coverage of the prostate gland. He understands that we will call and inform him of any  unexpected findings on further review of his imaging and dosimetry. ? ?Impression/Plan: 66 y.o. gentleman with Stage T1c adenocarcinoma of the prostate with Gleason score of 3+4, and PSA of 5.23. ?The patient is recovering from the effects of radiation. His urinary symptoms should gradually improve over the next 4-6 months. We talked about this today. He is encouraged by his improvement already and is otherwise pleased with his outcome. We also talked about long-term follow-up for prostate  cancer following seed implant. He understands that ongoing PSA determinations and digital rectal exams will help perform surveillance to rule out disease recurrence. He has a follow up appointment scheduled with Dr. Junious Silk on 05/21/2022. He understands what to expect with his PSA measures. Patient was also educated today about some of the long-term effects from radiation including a small risk for rectal bleeding and possibly erectile dysfunction. We talked about some of the general management approaches to these potential complications. However, I did encourage the patient to contact our office or return at any point if he has questions or concerns related to his previous radiation and prostate cancer. ? ? ? ?Nicholos Johns, PA-C ? ?

## 2022-02-21 NOTE — Progress Notes (Signed)
?  Radiation Oncology         (336) [phone] ?________________________________ ? ?Name: [name] MRN: [id number]  ?Date: 02/21/2022  DOB: 08-09-1956 ? ?COMPLEX SIMULATION NOTE ? ?NARRATIVE:  The patient was brought to the [facility] today following prostate seed implantation approximately one month ago.  Identity was confirmed.  All relevant records and images related to the planned course of therapy were reviewed.  Then, the patient was set-up supine.  CT images were obtained.  The CT images were loaded into the planning software.  Then the prostate and rectum were contoured.  Treatment planning then occurred.  The implanted iodine 125 seeds were identified by the physics staff for projection of radiation distribution  I have requested : 3D Simulation  I have requested a DVH of the following structures: Prostate and rectum.   ? ?________________________________ ? ?Sheral Apley Tammi Klippel, M.D. ? ?

## 2022-03-11 ENCOUNTER — Encounter: Payer: Self-pay | Admitting: Radiation Oncology

## 2022-03-11 ENCOUNTER — Ambulatory Visit
Admission: RE | Admit: 2022-03-11 | Discharge: 2022-03-11 | Disposition: A | Discharge status: Home / Self Care | Payer: 59 | Source: Ambulatory Visit | Attending: Radiation Oncology | Admitting: Radiation Oncology

## 2022-03-11 DIAGNOSIS — C61 Malignant neoplasm of prostate: Secondary | ICD-10-CM | POA: Diagnosis present

## 2022-04-10 NOTE — Progress Notes (Signed)
  Radiation Oncology         [phone] ________________________________  Name: Daniel Yoder MRN: [id number]  Date: 03/11/2022  DOB: 12-Aug-1956  3D Planning Note   Prostate Brachytherapy Post-Implant Dosimetry  Diagnosis: 66 y.o. gentleman with Stage T1c adenocarcinoma of the prostate with Gleason score of 3+4, and PSA of 5.23.  Narrative: On a previous date, Daniel Yoder returned following prostate seed implantation for post implant planning. He underwent CT scan complex simulation to delineate the three-dimensional structures of the pelvis and demonstrate the radiation distribution.  Since that time, the seed localization, and complex isodose planning with dose volume histograms have now been completed.  Results:   Prostate Coverage - The dose of radiation delivered to the 90% or more of the prostate gland (D90) was 116.79% of the prescription dose. This exceeds our goal of greater than 90%. Rectal Sparing - The volume of rectal tissue receiving the prescription dose or higher was 0.0 cc. This falls under our thresholds tolerance of 1.0 cc.  Impression: The prostate seed implant appears to show adequate target coverage and appropriate rectal sparing.  Plan:  The patient will continue to follow with urology for ongoing PSA determinations. I would anticipate a high likelihood for local tumor control with minimal risk for rectal morbidity.  ________________________________  Sheral Apley Tammi Klippel, M.D.

## 2022-11-19 ENCOUNTER — Other Ambulatory Visit: Payer: Self-pay | Admitting: Family Medicine

## 2022-11-19 DIAGNOSIS — M542 Cervicalgia: Secondary | ICD-10-CM

## 2022-11-26 ENCOUNTER — Other Ambulatory Visit: Payer: 59

## 2022-12-16 ENCOUNTER — Ambulatory Visit: Payer: 59 | Admitting: Neurology

## 2022-12-16 ENCOUNTER — Encounter: Payer: Self-pay | Admitting: Neurology

## 2022-12-16 VITALS — BP 128/82 | HR 85 | Ht 73.0 in | Wt 199.0 lb

## 2022-12-16 DIAGNOSIS — G40109 Localization-related (focal) (partial) symptomatic epilepsy and epileptic syndromes with simple partial seizures, not intractable, without status epilepticus: Secondary | ICD-10-CM

## 2022-12-16 DIAGNOSIS — Z5181 Encounter for therapeutic drug level monitoring: Secondary | ICD-10-CM

## 2022-12-16 MED ORDER — OXCARBAZEPINE 300 MG PO TABS: 300.0000 mg | ORAL_TABLET | Freq: Two times a day (BID) | ORAL | 11 refills | Status: DC

## 2022-12-16 NOTE — Patient Instructions (Addendum)
Continue with Trileptal 300 mg daily Will check a Trileptal level today with BMP Continue other medication Follow-up in 1 year or sooner if worse

## 2022-12-16 NOTE — Progress Notes (Signed)
[facility]  PATIENT: Daniel Yoder DOB: Jun 25, 1956  REFERRING CLINICIAN: Vernie Shanks, MD HISTORY FROM: Patient  REASON FOR VISIT: Seizure/Temporal lobe epilepsy.    HISTORICAL  CHIEF COMPLAINT:  Chief Complaint  Patient presents with   Follow-up    Rm 13 alone here for yearly f/u. Reports he is doing well denies any seizure activity.     INTERVAL HISTORY 12/16/2022 Daniel Yoder presents today for follow-up, last visit was a year ago.  Since then he has been doing well, on Trileptal, denies any seizure or seizure-like activity. Actually, he supposed to be on Trileptal 300 mg twice daily but only takes the medication daily and reports no seizures and no side effects. He continues to work, no issues, reports that his sleep is fine, again no major complaint.   INTERVAL HISTORY 12/10/2021:  Daniel Yoder presents today for follow-up, last visit in November, at that time plan was to continue lamotrigine for his temporal lobe epilepsy.  Since then he has contacted me complaining of headaches after taking lamotrigine, because headache is a known side effect of lamotrigine, I switched him to oxcarbazepine.  He is doing well on oxcarbazepine 300 mg twice daily, denies any additional seizures or seizure-like event.  He is satisfied with his current regimen.  He reported that recently he was diagnosed with prostate cancer, and is set up to have treatment in March, he is in good mood. All issues discussed today is his elevated blood pressure, he did follow with his primary care doctor who asked him to double his antihypertensive medications.    INTERVAL HISTORY 09/04/2021 Patient presents today for follow-up, at last visit plan was to start lamotrigine 25 mg daily and titrate up to 100 mg twice daily.  He did that titration without issues.  He reported a couple of brief moment when he had the strong smell of exhaust pipe and 2 weeks ago while returning from the [place] he  got stuck in traffic was very stressed out and started having the strong smell.  The same exhaust pipe smell that patient report smelling starting from 4 PM until the next day around 11 AM.  He did tried rubbing Vicks around his nostril without help and try sleeping aid and after waking up the next morning around 11 AM the smell subsided.  Since that day, 2 weeks ago, he had not had any additional event.  I told Mr. Bone that I am concerned that the event that he had may have been a long seizure, epilepsia partialis continua due to the fact that he was only taking 50 mg of lamotrigine twice daily.  I did informed him in the future if he had any longer event lasting more than 5 minutes to give me a call.  Again he reports for the past 2 weeks he has not had any additional event which corresponded to him taking 75 mg twice daily and 100 mg twice daily of lamotrigine.  Since last visit MRI brain was done showing no acute intracranial abnormality and his ambulatory EEG showed left temporal area of epileptogenic potential.   HISTORY OF PRESENT ILLNESS:  This is a 67 year old male with past medical history of diabetes, hypertension, hyperlipidemia, focal seizure diagnosed 20 years ago who is presenting for new symptoms concerning for seizure.  Patient said that 20 years ago he was treated for temporal lobe epilepsy.  At that time he was having a funny sense of smell described as smelling burning metal.  He was seen by neurology had a MRI brain and EEG which was abnormal and patient was started on medication.  He reported he was trialed on 3 different antiseizure medications, does not remember their names but the last medication was able to control the seizures.  After a few years being seizure-free he discontinued medication and has not had had any seizures until about a month and a half ago.  He said about a month and a half ago he was driving on the highway and had a exhaust pipe smell he thought it was the car  in front of him that was admitting that smell but since after being home this smell linger.  He mentions since then he is on average about once a week he will have the same exact exhaust pipe smell and he thought that this could have been his seizures coming back therefore he presented to his primary care doctor who referred him to neurology.  These events last a few seconds to a few minute, he cannot pinpoint a trigger.  He denies any loss of consciousness with this event, denies any convulsion.  Mentioned that his seizures are always during sensory aware seizure.  He also reported that wife has noted that he has twitching of his right arm.     Handedness: Right   Seizure Type: Focal aware (bad smell)   Current frequency: once a week   Any injuries from seizures: None   Seizure risk factors: Head injury 23 years ago, tree branch fell on top of his head. Father with seizure  Previous ASMs: tried 3 ASM, does not remember the name, Lamotrigine   Currenty ASMs: Trileptal 300 mg daily   ASMs side effects: Depressed   Brain Images:  Not available   Previous EEGs: Not available    OTHER MEDICAL CONDITIONS: DMII, HTN, HLD  REVIEW OF SYSTEMS: Full 14 system review of systems performed and negative with exception of: as noted in the HPI  ALLERGIES: Allergies  Allergen Reactions   Amoxicillin     Other reaction(s): severe nausea   Penicillins Nausea Only   Septra [Sulfamethoxazole-Trimethoprim]     Other reaction(s): GI Upset (05/2018)    HOME MEDICATIONS: Outpatient Medications Prior to Visit  Medication Sig Dispense Refill   alfuzosin (UROXATRAL) 10 MG 24 hr tablet Take 1 tablet (10 mg total) by mouth daily with breakfast. 90 tablet 0   Blood Glucose Monitoring Suppl (ONETOUCH VERIO) w/Device KIT See admin instructions.     carvedilol (COREG) 25 MG tablet 1 tablet     diclofenac (VOLTAREN) 75 MG EC tablet Take 75 mg by mouth daily.     diltiazem (CARDIZEM CD) 120 MG 24 hr capsule  Take 120 mg by mouth daily.     Empagliflozin-metFORMIN HCl ER (SYNJARDY XR) 12.03-999 MG TB24 1 tablet     meloxicam (MOBIC) 7.5 MG tablet      rosuvastatin (CRESTOR) 10 MG tablet Take 10 mg by mouth at bedtime.     solifenacin (VESICARE) 5 MG tablet Take 5 mg by mouth daily.     tadalafil (CIALIS) 5 MG tablet      valsartan-hydrochlorothiazide (DIOVAN-HCT) 320-25 MG tablet   12   Oxcarbazepine (TRILEPTAL) 300 MG tablet Take 1 tablet (300 mg total) by mouth 2 (two) times daily. 60 tablet 11   No facility-administered medications prior to visit.    PAST MEDICAL HISTORY: Past Medical History:  Diagnosis Date   Diabetes mellitus without complication ([place])    Epilepsy (  [place])    Hypertension    PONV (postoperative nausea and vomiting)    WPW (Wolff-Parkinson-White syndrome) 11/03/2001   Ablation 2003    PAST SURGICAL HISTORY: Past Surgical History:  Procedure Laterality Date   CYSTOSCOPY  01/31/2022   Procedure: CYSTOSCOPY FLEXIBLE;  Surgeon: Festus Aloe, MD;  Location: [facility];  Service: Urology;;   RADIOACTIVE SEED IMPLANT N/A 01/31/2022   Procedure: RADIOACTIVE SEED IMPLANT/BRACHYTHERAPY IMPLANT;  Surgeon: Festus Aloe, MD;  Location: [facility];  Service: Urology;  Laterality: N/A;   ROTATOR CUFF REPAIR Right    X2 by Dr. Rhona Raider   SPACE OAR INSTILLATION N/A 01/31/2022   Procedure: SPACE OAR INSTILLATION;  Surgeon: Festus Aloe, MD;  Location: [facility];  Service: Urology;  Laterality: N/A;    FAMILY HISTORY: Family History  Problem Relation Age of Onset   Hypertension Brother    Diabetes Mellitus II Brother    Heart disease Brother        Prostate Cancer   Diabetes Mellitus II Brother    Hypertension Brother    Hypertension Mother    Heart disease Mother        CHF   Stroke Father    Hypertension Father    Diabetes Mellitus II Father     SOCIAL HISTORY: Social History   Socioeconomic History    Marital status: Married    Spouse name: Not on file   Number of children: Not on file   Years of education: Not on file   Highest education level: Not on file  Occupational History   Not on file  Tobacco Use   Smoking status: Former    Packs/day: 0.00    Years: 30.00    Total pack years: 0.00    Types: Cigarettes   Smokeless tobacco: Never  Vaping Use   Vaping Use: Never used  Substance and Sexual Activity   Alcohol use: No    Alcohol/week: 0.0 standard drinks of alcohol   Drug use: No   Sexual activity: Not on file  Other Topics Concern   Not on file  Social History Narrative   Not on file   Social Determinants of Health   Financial Resource Strain: Not on file  Food Insecurity: Not on file  Transportation Needs: Not on file  Physical Activity: Not on file  Stress: Not on file  Social Connections: Not on file  Intimate Partner Violence: Not on file     PHYSICAL EXAM -GENERAL EXAM/CONSTITUTIONAL: Vitals:  Vitals:   12/16/22 1127  BP: 128/82  Pulse: 85  Weight: 199 lb (90.3 kg)  Height: 6' 1"$  (1.854 m)    Body mass index is 26.25 kg/m. Wt Readings from Last 3 Encounters:  12/16/22 199 lb (90.3 kg)  02/21/22 210 lb (95.3 kg)  01/31/22 210 lb 8 oz (95.5 kg)   Patient is in no distress; well developed, nourished and groomed; neck is supple  EYES: Visual fields full to confrontation, Extraocular movements intacts,   MUSCULOSKELETAL: Gait, strength, tone, movements noted in Neurologic exam below  NEUROLOGIC: MENTAL STATUS:  awake, alert, oriented to person, place and time recent and remote memory intact normal attention and concentration language fluent, comprehension intact, naming intact fund of knowledge appropriate  CRANIAL NERVE:  2nd, 3rd, 4th, 6th - visual fields full to confrontation, extraocular muscles intact, no nystagmus 5th - facial sensation symmetric 7th - facial strength symmetric 8th - hearing intact 9th - palate elevates  symmetrically, uvula midline 11th -  shoulder shrug symmetric 12th - tongue protrusion midline  MOTOR:  normal bulk and tone, full strength in the BUE, BLE  SENSORY:  normal and symmetric to light touch, pinprick, temperature, vibration  COORDINATION:  finger-nose-finger, fine finger movements normal  REFLEXES:  deep tendon reflexes present and symmetric  GAIT/STATION:  normal   DIAGNOSTIC DATA (LABS, IMAGING, TESTING) - I reviewed patient records, labs, notes, testing and imaging myself where available.  Lab Results  Component Value Date   WBC 9.4 07/16/2020   HGB 15.0 01/31/2022   HCT 44.0 01/31/2022   MCV 74.8 (L) 07/16/2020   PLT 329 07/16/2020      Component Value Date/Time   NA 137 01/31/2022 1133   NA 144 12/10/2021 1154   K 4.2 01/31/2022 1133   CL 105 01/31/2022 1133   CO2 21 12/10/2021 1154   GLUCOSE 138 (H) 01/31/2022 1133   BUN 24 (H) 01/31/2022 1133   BUN 15 12/10/2021 1154   CREATININE 1.20 01/31/2022 1133   CALCIUM 9.9 12/10/2021 1154   PROT 7.0 12/10/2021 1154   ALBUMIN 4.5 12/10/2021 1154   AST 16 12/10/2021 1154   ALT 14 12/10/2021 1154   ALKPHOS 86 12/10/2021 1154   BILITOT <0.2 12/10/2021 1154   GFRNONAA 51 (L) 07/16/2020 1435   GFRAA 59 (L) 07/16/2020 1435   Lab Results  Component Value Date   CHOL 187 11/17/2015   HDL 30 (L) 11/17/2015   LDLCALC 108 (H) 11/17/2015   TRIG 244 (H) 11/17/2015   Lab Results  Component Value Date   HGBA1C 13.7 (H) 07/16/2020   No results found for: "VITAMINB12" No results found for: "TSH"  MRI BRAIN 07/2021 Normal MRI brain (with and without).   AMB EEG 08/2021: This is an abnormal ambulatory VEEG due to left temporal intermittent slowing and left temporal sharps. This is consistent with an area of epileptogenic potential in the left temporal lobe.  No events were recorded  I personally review both MRI and EEG.    ASSESSMENT AND PLAN  67 y.o. year old male  with past medical history of left  temporal seizure, hypertension, hyperlipidemia, diabetes who is presenting for follow-up for his temporal lobe seizure.  Overall she is doing well on Trileptal 300 mg daily.  I will check a level today but will most likely keep patient on the same medication.  Advised him to contact me if he does have a breakthrough seizure otherwise I will see him in 1 year for follow-up.  He voices understanding.    He was started on lamotrigine 25 mg with plan to uptitrate to 100 mg twice daily for which he achieved this week.  During this time he reported 1 event of seizure, strong exhaust fume smell starting from 4 PM in the afternoon until 11 AM the next day consistent with epilepsia partialis continua 2 weeks ago.  At that time he was only taking 50 mg of lamotrigine twice daily.  Since that event 2 weeks ago he has not had any additional seizures.  He is currently on 100 mg lamotrigine tolerating it well, no side effect.  I will obtain a level today and continue the patient on lamotrigine 100 mg twice daily.  I will also advise him to call me if he had a seizure lasting more than 5 minutes.  I will see him in 3 months for follow-up.   1. Temporal lobe seizure ([place])   2. Therapeutic drug monitoring      Patient Instructions  Continue with Trileptal 300 mg daily Will check a Trileptal level today with BMP Continue other medication Follow-up in 1 year or sooner if worse    Per [facility] statutes, patients with seizures are not allowed to drive until they have been seizure-free for six months.  Other recommendations include using caution when using heavy equipment or power tools. Avoid working on ladders or at heights. Take showers instead of baths.  Do not swim alone.  Ensure the water temperature is not too high on the home water heater. Do not go swimming alone. Do not lock yourself in a room alone (i.e. bathroom). When caring for infants or small children, sit down when holding, feeding, or  changing them to minimize risk of injury to the child in the event you have a seizure. Maintain good sleep hygiene. Avoid alcohol.  Also recommend adequate sleep, hydration, good diet and minimize stress.   During the Seizure  - First, ensure adequate ventilation and place patients on the floor on their left side  Loosen clothing around the neck and ensure the airway is patent. If the patient is clenching the teeth, do not force the mouth open with any object as this can cause severe damage - Remove all items from the surrounding that can be hazardous. The patient may be oblivious to what's happening and may not even know what he or she is doing. If the patient is confused and wandering, either gently guide him/her away and block access to outside areas - Reassure the individual and be comforting - Call 911. In most cases, the seizure ends before EMS arrives. However, there are cases when seizures may last over 3 to 5 minutes. Or the individual may have developed breathing difficulties or severe injuries. If a pregnant patient or a person with diabetes develops a seizure, it is prudent to call an ambulance. - Finally, if the patient does not regain full consciousness, then call EMS. Most patients will remain confused for about 45 to 90 minutes after a seizure, so you must use judgment in calling for help. - Avoid restraints but make sure the patient is in a bed with padded side rails - Place the individual in a lateral position with the neck slightly flexed; this will help the saliva drain from the mouth and prevent the tongue from falling backward - Remove all nearby furniture and other hazards from the area - Provide verbal assurance as the individual is regaining consciousness - Provide the patient with privacy if possible - Call for help and start treatment as ordered by the caregiver   After the Seizure (Postictal Stage)  After a seizure, most patients experience confusion, fatigue, muscle  pain and/or a headache. Thus, one should permit the individual to sleep. For the next few days, reassurance is essential. Being calm and helping reorient the person is also of importance.  Most seizures are painless and end spontaneously. Seizures are not harmful to others but can lead to complications such as stress on the lungs, brain and the heart. Individuals with prior lung problems may develop labored breathing and respiratory distress.     Orders Placed This Encounter  Procedures   [id number]   Basic Metabolic Panel     Meds ordered this encounter  Medications   DISCONTD: Oxcarbazepine (TRILEPTAL) 300 MG tablet    Sig: Take 1 tablet (300 mg total) by mouth 2 (two) times daily.    Dispense:  60 tablet    Refill:  11  Oxcarbazepine (TRILEPTAL) 300 MG tablet    Sig: Take 1 tablet (300 mg total) by mouth 2 (two) times daily.    Dispense:  60 tablet    Refill:  11     Return in about 1 year (around 12/17/2023).    Alric Ran, MD 12/16/2022, 5:13 PM  [facility] [address], [place], [place] [address] [phone]

## 2022-12-22 LAB — BASIC METABOLIC PANEL
BUN/Creatinine Ratio: 13 (ref 10–24)
BUN: 16 mg/dL (ref 8–27)
CO2: 22 mmol/L (ref 20–29)
Calcium: 9.8 mg/dL (ref 8.6–10.2)
Chloride: 102 mmol/L (ref 96–106)
Creatinine, Ser: 1.28 mg/dL — ABNORMAL HIGH (ref 0.76–1.27)
Glucose: 105 mg/dL — ABNORMAL HIGH (ref 70–99)
Potassium: 3.9 mmol/L (ref 3.5–5.2)
Sodium: 139 mmol/L (ref 134–144)
eGFR: 62 mL/min/{1.73_m2} (ref >59)

## 2022-12-22 LAB — 10-HYDROXYCARBAZEPINE: Oxcarbazepine SerPl-Mcnc: 1 ug/mL — ABNORMAL LOW (ref 10–35)

## 2023-07-14 ENCOUNTER — Other Ambulatory Visit: Payer: Self-pay | Admitting: Family Medicine

## 2023-07-14 DIAGNOSIS — Z87891 Personal history of nicotine dependence: Secondary | ICD-10-CM

## 2023-07-28 ENCOUNTER — Ambulatory Visit
Admission: RE | Admit: 2023-07-28 | Discharge: 2023-07-28 | Disposition: A | Discharge status: Home / Self Care | Payer: Medicare Other | Source: Ambulatory Visit | Attending: Family Medicine | Admitting: Family Medicine

## 2023-07-28 DIAGNOSIS — Z87891 Personal history of nicotine dependence: Secondary | ICD-10-CM

## 2023-12-15 ENCOUNTER — Telehealth: Payer: Self-pay | Admitting: Neurology

## 2023-12-15 NOTE — Telephone Encounter (Signed)
Pt called to verifiy appointment

## 2023-12-17 ENCOUNTER — Ambulatory Visit: Payer: Medicare Other | Admitting: Neurology

## 2024-01-03 ENCOUNTER — Other Ambulatory Visit: Payer: Self-pay | Admitting: Neurology

## 2024-01-04 ENCOUNTER — Other Ambulatory Visit: Payer: Self-pay

## 2024-05-04 ENCOUNTER — Encounter: Payer: Self-pay | Admitting: Neurology

## 2024-05-04 ENCOUNTER — Ambulatory Visit: Admitting: Neurology

## 2024-05-04 VITALS — BP 142/101 | HR 80 | Ht 73.0 in | Wt 202.5 lb

## 2024-05-04 DIAGNOSIS — R453 Demoralization and apathy: Secondary | ICD-10-CM | POA: Diagnosis not present

## 2024-05-04 DIAGNOSIS — F32A Depression, unspecified: Secondary | ICD-10-CM | POA: Diagnosis not present

## 2024-05-04 DIAGNOSIS — G40109 Localization-related (focal) (partial) symptomatic epilepsy and epileptic syndromes with simple partial seizures, not intractable, without status epilepticus: Secondary | ICD-10-CM

## 2024-05-04 MED ORDER — OXCARBAZEPINE 300 MG PO TABS: 300.0000 mg | ORAL_TABLET | Freq: Two times a day (BID) | ORAL | 3 refills | Status: AC
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
## 2024-05-05 ENCOUNTER — Telehealth: Payer: Self-pay

## 2024-05-05 NOTE — Telephone Encounter (Signed)
 Referral sent to [facility] (P) [phone] ([phone]

## 2024-05-05 NOTE — Patient Instructions (Addendum)
 Increase Oxcarbazepine  to 300 mg twice daily  Referral to psychology to management of apathy, depressive symptoms  Continue to follow up with PCP  Return as scheduled in December

## 2024-05-30 ENCOUNTER — Telehealth: Payer: Self-pay | Admitting: Neurology

## 2024-05-30 NOTE — Telephone Encounter (Signed)
 Pt called to informed MD that he had another Dizzy spell this morning . Pt states the last time Pt  seen MD  he was told to follow up if he had another dizzy spell. Pt is following up with MD  and stated that this time is lasted for 10 min the patient is  not sire what MD  want him to do . He just  wanted MD to know .

## 2024-05-30 NOTE — Telephone Encounter (Signed)
 Returned call to patient, he states he has another dizzy spell today, kinda like vertigo, he had double vision, felt like it was shifting when his head was still. Patient states nothing was normal, like looking through a bottom of a glass. It last about 10-12 minutes and after about 20 minutes, he back to baseline. He states blood sugar and pressures have been in the normal ranges. He states Dr. Gregg wanted to call if this happened again. Advised I would send to him to review and follow back up. Patient appreciative of call

## 2024-06-01 ENCOUNTER — Other Ambulatory Visit: Payer: Self-pay | Admitting: Neurology

## 2024-06-01 DIAGNOSIS — G459 Transient cerebral ischemic attack, unspecified: Secondary | ICD-10-CM

## 2024-06-01 DIAGNOSIS — H532 Diplopia: Secondary | ICD-10-CM

## 2024-06-01 DIAGNOSIS — H814 Vertigo of central origin: Secondary | ICD-10-CM

## 2024-06-01 NOTE — Telephone Encounter (Signed)
 Spoke with patient, will order MRI/MRA

## 2024-06-02 ENCOUNTER — Telehealth: Payer: Self-pay | Admitting: Neurology

## 2024-06-02 NOTE — Telephone Encounter (Signed)
 no auth required sent to GI [phone]

## 2024-06-20 ENCOUNTER — Ambulatory Visit: Payer: Medicare Other | Admitting: Neurology

## 2024-07-12 ENCOUNTER — Ambulatory Visit
Admission: RE | Admit: 2024-07-12 | Discharge: 2024-07-12 | Disposition: A | Discharge status: Home / Self Care | Source: Ambulatory Visit | Attending: Neurology | Admitting: Neurology

## 2024-07-12 ENCOUNTER — Ambulatory Visit
Admission: RE | Admit: 2024-07-12 | Discharge: 2024-07-12 | Disposition: A | Discharge status: Home / Self Care | Source: Ambulatory Visit | Attending: Neurology

## 2024-07-12 DIAGNOSIS — H532 Diplopia: Secondary | ICD-10-CM | POA: Diagnosis not present

## 2024-07-12 DIAGNOSIS — H814 Vertigo of central origin: Secondary | ICD-10-CM

## 2024-07-12 DIAGNOSIS — G459 Transient cerebral ischemic attack, unspecified: Secondary | ICD-10-CM | POA: Diagnosis not present

## 2024-07-12 MED ORDER — GADOPICLENOL 0.5 MMOL/ML IV SOLN
10.0000 mL | Freq: Once | INTRAVENOUS | Status: AC | PRN
  Administered 2024-07-12: 10 mL via INTRAVENOUS

## 2024-07-14 ENCOUNTER — Ambulatory Visit: Payer: Self-pay | Admitting: Neurology

## 2024-07-22 NOTE — Telephone Encounter (Signed)
 Pt called to request a call about PT MRI results. Help Pt get acces To My chart as well.

## 2024-07-25 NOTE — Telephone Encounter (Signed)
 Pt has been called and informed of nurses message of the MRI being all Normal. Pt verbalized appreciation.

## 2024-10-03 ENCOUNTER — Telehealth: Payer: Self-pay | Admitting: Neurology

## 2024-10-03 NOTE — Telephone Encounter (Signed)
 Appointment cx pt out of town

## 2024-10-05 ENCOUNTER — Ambulatory Visit: Admitting: Neurology
